# Patient Record
Sex: Female | Born: 1965 | Race: White | Hispanic: No | Marital: Married | Smoking: Never smoker
Health system: Southern US, Community
[De-identification: ages and names within clinical notes are randomized; demographics above are authoritative.]

## PROBLEM LIST (undated history)

## (undated) DIAGNOSIS — Z87442 Personal history of urinary calculi: Secondary | ICD-10-CM

## (undated) DIAGNOSIS — M1990 Unspecified osteoarthritis, unspecified site: Secondary | ICD-10-CM

## (undated) DIAGNOSIS — K219 Gastro-esophageal reflux disease without esophagitis: Secondary | ICD-10-CM

## (undated) DIAGNOSIS — F419 Anxiety disorder, unspecified: Secondary | ICD-10-CM

## (undated) DIAGNOSIS — C439 Malignant melanoma of skin, unspecified: Secondary | ICD-10-CM

## (undated) HISTORY — PX: BACK SURGERY: SHX140

## (undated) HISTORY — PX: AUGMENTATION MAMMAPLASTY: SUR837

## (undated) HISTORY — DX: Anxiety disorder, unspecified: F41.9

## (undated) HISTORY — PX: TUBAL LIGATION: SHX77

---

## 1989-08-19 DIAGNOSIS — Z87442 Personal history of urinary calculi: Secondary | ICD-10-CM

## 1989-08-19 HISTORY — DX: Personal history of urinary calculi: Z87.442

## 2000-08-19 HISTORY — PX: ABDOMINAL HYSTERECTOMY: SHX81

## 2005-09-30 ENCOUNTER — Ambulatory Visit: Payer: Self-pay | Admitting: Dermatology

## 2006-08-06 ENCOUNTER — Emergency Department: Payer: Self-pay | Admitting: Unknown Physician Specialty

## 2009-02-28 ENCOUNTER — Ambulatory Visit: Payer: Self-pay

## 2009-08-19 DIAGNOSIS — C439 Malignant melanoma of skin, unspecified: Secondary | ICD-10-CM

## 2009-08-19 HISTORY — DX: Malignant melanoma of skin, unspecified: C43.9

## 2009-10-25 ENCOUNTER — Encounter: Payer: Self-pay | Admitting: Cardiovascular Disease

## 2009-10-25 ENCOUNTER — Emergency Department: Payer: Self-pay | Admitting: Emergency Medicine

## 2009-10-26 ENCOUNTER — Encounter: Payer: Self-pay | Admitting: Cardiovascular Disease

## 2009-10-26 DIAGNOSIS — R079 Chest pain, unspecified: Secondary | ICD-10-CM | POA: Insufficient documentation

## 2009-10-30 ENCOUNTER — Ambulatory Visit: Payer: Self-pay

## 2009-10-30 ENCOUNTER — Ambulatory Visit: Payer: Self-pay | Admitting: Cardiology

## 2009-10-30 ENCOUNTER — Encounter (HOSPITAL_COMMUNITY): Admission: RE | Admit: 2009-10-30 | Discharge: 2009-12-20 | Payer: Self-pay | Admitting: Cardiovascular Disease

## 2009-12-08 ENCOUNTER — Emergency Department: Payer: Self-pay | Admitting: Unknown Physician Specialty

## 2009-12-08 ENCOUNTER — Ambulatory Visit: Payer: Self-pay | Admitting: Cardiology

## 2009-12-08 ENCOUNTER — Emergency Department: Payer: Self-pay | Admitting: Emergency Medicine

## 2009-12-08 ENCOUNTER — Emergency Department (HOSPITAL_COMMUNITY): Admission: EM | Admit: 2009-12-08 | Discharge: 2009-12-08 | Payer: Self-pay | Admitting: Emergency Medicine

## 2009-12-09 ENCOUNTER — Encounter: Payer: Self-pay | Admitting: Cardiovascular Disease

## 2010-07-18 ENCOUNTER — Ambulatory Visit: Payer: Self-pay

## 2010-09-18 NOTE — Miscellaneous (Signed)
Summary: chest pain  Clinical Lists Changes  Problems: Added new problem of CHEST PAIN UNSPECIFIED (ICD-786.50) Orders: Added new Referral order of Nuclear Stress Test (Nuc Stress Test) - Signed

## 2010-09-18 NOTE — Assessment & Plan Note (Signed)
Summary: Cardiology Nuclear Study  Nuclear Med Background Indications for Stress Test: Evaluation for Ischemia, Post Hospital  Indications Comments: 10/25/09 [facility] UV:OZDGU pain, MI ruled out   History Comments: NO DOCUMENTED CAD  Symptoms: Chest Pressure, Fatigue, Nausea  Symptoms Comments: Last episode of YQ:IHKV am, none now.   Nuclear Pre-Procedure Cardiac Risk Factors: Family History - CAD Caffeine/Decaff Intake: None NPO After: 7:00 PM Lungs: Clear IV 0.9% NS with Angio Cath: 20g     IV Site: (R) AC IV Started by: Irean Hong RN Chest Size (in) 34     Cup Size C     Height (in): 63 Weight (lb): 120 BMI: 21.33  Nuclear Med Study 1 or 2 day study:  1 day     Stress Test Type:  Stress Reading MD:  Willa Rough, MD     Referring MD:  Julien Nordmann, MD Resting Radionuclide:  Technetium 8m Tetrofosmin     Resting Radionuclide Dose:  11.0 mCi  Stress Radionuclide:  Technetium 68m Tetrofosmin     Stress Radionuclide Dose:  33.0 mCi   Stress Protocol Exercise Time (min):  10:40 min     Max HR:  179 bpm     Predicted Max HR:  177 bpm  Max Systolic BP: 148 mm Hg     Percent Max HR:  101.13 %     METS: 12.8 Rate Pressure Product:  42595    Stress Test Technologist:  [employer] CMA-N     Nuclear Technologist:  Domenic Polite CNMT  Rest Procedure  Myocardial perfusion imaging was performed at rest 45 minutes following the intravenous administration of Myoview Technetium 78m Tetrofosmin.  Stress Procedure  The patient exercised for 10:40.  The patient stopped due to fatigue and denied any chest pain.  There were no significant ST-T wave changes.  Myoview was injected at peak exercise and myocardial perfusion imaging was performed after a brief delay.  QPS Raw Data Images:  Normal; no motion artifact; normal heart/lung ratio. Stress Images:  Anterior breast attenuation (implants) Rest Images:  Same as stress Subtraction (SDS):  No evidence of  ischemia. Transient Ischemic Dilatation:  1.1  (Normal <1.22)  Lung/Heart Ratio:  .23  (Normal <0.45)  Quantitative Gated Spect Images QGS EDV:  73 ml QGS ESV:  32 ml QGS EF:  57 % QGS cine images:  Normal motion  Findings Normal nuclear study      Overall Impression  Exercise Capacity: Good exercise capacity. BP Response: Normal blood pressure response. Clinical Symptoms: No chest pain ECG Impression: No significant ST segment change suggestive of ischemia. Overall Impression Comments: There is no scar or ischemia. Wall motion is normal. There is anterior breast attenuation.  Appended Document: Cardiology Nuclear Study pt aware. Charlena Cross RN BSN

## 2010-09-18 NOTE — Procedures (Signed)
Summary: [facility] Holter  [facility] Holter   Imported By: Harlon Flor 01/04/2010 10:52:36  _____________________________________________________________________  External Attachment:    Type:   Image     Comment:   External Document

## 2010-11-06 LAB — POCT I-STAT, CHEM 8
Hemoglobin: 13.3 g/dL (ref 12.0–15.0)
Sodium: 139 mEq/L (ref 135–145)
TCO2: 21 mmol/L (ref 0–100)

## 2010-11-06 LAB — DIFFERENTIAL
Basophils Absolute: 0 10*3/uL (ref 0.0–0.1)
Basophils Relative: 0 % (ref 0–1)
Eosinophils Absolute: 0 10*3/uL (ref 0.0–0.7)
Neutrophils Relative %: 83 % — ABNORMAL HIGH (ref 43–77)

## 2010-11-06 LAB — CBC
MCHC: 34.2 g/dL (ref 30.0–36.0)
MCV: 87.7 fL (ref 78.0–100.0)
Platelets: 272 10*3/uL (ref 150–400)
WBC: 7.6 10*3/uL (ref 4.0–10.5)

## 2010-11-06 LAB — TSH: TSH: 2.764 u[IU]/mL (ref 0.350–4.500)

## 2010-11-06 LAB — D-DIMER, QUANTITATIVE: D-Dimer, Quant: 1.75 ug/mL-FEU — ABNORMAL HIGH (ref 0.00–0.48)

## 2011-10-08 ENCOUNTER — Ambulatory Visit: Payer: Self-pay

## 2012-08-19 HISTORY — PX: BREAST BIOPSY: SHX20

## 2013-04-05 ENCOUNTER — Ambulatory Visit: Payer: Self-pay | Admitting: Family Medicine

## 2013-04-07 ENCOUNTER — Ambulatory Visit: Payer: Self-pay | Admitting: Family Medicine

## 2013-04-22 ENCOUNTER — Ambulatory Visit: Payer: Self-pay | Admitting: Obstetrics and Gynecology

## 2013-04-23 LAB — PATHOLOGY REPORT

## 2013-05-17 ENCOUNTER — Ambulatory Visit: Payer: Self-pay | Admitting: Specialist

## 2013-10-18 LAB — HM PAP SMEAR: HM Pap smear: NEGATIVE

## 2014-01-19 ENCOUNTER — Ambulatory Visit: Payer: Self-pay | Admitting: Obstetrics and Gynecology

## 2014-09-05 ENCOUNTER — Ambulatory Visit: Payer: Self-pay | Admitting: Obstetrics and Gynecology

## 2015-04-13 ENCOUNTER — Other Ambulatory Visit: Payer: Self-pay

## 2015-04-13 NOTE — Telephone Encounter (Signed)
Fax [facility] to refill Lorazepam 1mg  take 1 to 2 tablets at bedtime. She was last seen In January 2016 and no future appts have been made. Please review-aa

## 2015-04-13 NOTE — Telephone Encounter (Signed)
Ok to print 1 month supply==appt for further refills.thx

## 2015-04-14 MED ORDER — LORAZEPAM 1 MG PO TABS: ORAL_TABLET | ORAL | Status: AC

## 2015-04-14 NOTE — Telephone Encounter (Signed)
Pt advised and RX placed up front for pick up-aa

## 2015-06-20 ENCOUNTER — Other Ambulatory Visit: Payer: Self-pay | Admitting: Internal Medicine

## 2015-06-20 DIAGNOSIS — Z1231 Encounter for screening mammogram for malignant neoplasm of breast: Secondary | ICD-10-CM

## 2015-06-21 ENCOUNTER — Ambulatory Visit
Admission: RE | Admit: 2015-06-21 | Discharge: 2015-06-21 | Disposition: A | Discharge status: Home / Self Care | Payer: No Typology Code available for payment source | Source: Ambulatory Visit | Attending: Internal Medicine | Admitting: Internal Medicine

## 2015-06-21 DIAGNOSIS — Z9882 Breast implant status: Secondary | ICD-10-CM | POA: Diagnosis not present

## 2015-06-21 DIAGNOSIS — Z1231 Encounter for screening mammogram for malignant neoplasm of breast: Secondary | ICD-10-CM | POA: Diagnosis present

## 2015-10-23 ENCOUNTER — Encounter: Payer: Self-pay | Admitting: *Deleted

## 2015-10-26 ENCOUNTER — Encounter: Payer: Self-pay | Admitting: Obstetrics and Gynecology

## 2015-11-30 ENCOUNTER — Emergency Department
Admission: EM | Admit: 2015-11-30 | Discharge: 2015-11-30 | Disposition: A | Discharge status: Home / Self Care | Payer: BLUE CROSS/BLUE SHIELD | Attending: Emergency Medicine | Admitting: Emergency Medicine

## 2015-11-30 ENCOUNTER — Encounter: Payer: Self-pay | Admitting: Obstetrics and Gynecology

## 2015-11-30 ENCOUNTER — Emergency Department: Payer: BLUE CROSS/BLUE SHIELD

## 2015-11-30 DIAGNOSIS — R002 Palpitations: Secondary | ICD-10-CM | POA: Diagnosis not present

## 2015-11-30 DIAGNOSIS — I10 Essential (primary) hypertension: Secondary | ICD-10-CM | POA: Diagnosis present

## 2015-11-30 LAB — CBC WITH DIFFERENTIAL/PLATELET
BASOS PCT: 1 %
Basophils Absolute: 0.1 10*3/uL (ref 0–0.1)
EOS ABS: 0.1 10*3/uL (ref 0–0.7)
EOS PCT: 1 %
HCT: 36.5 % (ref 35.0–47.0)
HEMOGLOBIN: 12.9 g/dL (ref 12.0–16.0)
Lymphocytes Relative: 45 %
Lymphs Abs: 3.1 10*3/uL (ref 1.0–3.6)
MCH: 29 pg (ref 26.0–34.0)
MCHC: 35.3 g/dL (ref 32.0–36.0)
MCV: 82 fL (ref 80.0–100.0)
Monocytes Absolute: 0.5 10*3/uL (ref 0.2–0.9)
Monocytes Relative: 8 %
NEUTROS PCT: 45 %
Neutro Abs: 3.1 10*3/uL (ref 1.4–6.5)
PLATELETS: 294 10*3/uL (ref 150–440)
RBC: 4.45 MIL/uL (ref 3.80–5.20)
RDW: 13.5 % (ref 11.5–14.5)
WBC: 6.9 10*3/uL (ref 3.6–11.0)

## 2015-11-30 LAB — BASIC METABOLIC PANEL
Anion gap: 10 (ref 5–15)
BUN: 15 mg/dL (ref 6–20)
CALCIUM: 9.1 mg/dL (ref 8.9–10.3)
CO2: 20 mmol/L — ABNORMAL LOW (ref 22–32)
CREATININE: 0.72 mg/dL (ref 0.44–1.00)
Chloride: 104 mmol/L (ref 101–111)
GFR calc non Af Amer: >60 mL/min (ref >60)
Glucose, Bld: 117 mg/dL — ABNORMAL HIGH (ref 65–99)
Potassium: 2.8 mmol/L — CL (ref 3.5–5.1)
SODIUM: 134 mmol/L — AB (ref 135–145)

## 2015-11-30 LAB — TSH: TSH: 2.818 u[IU]/mL (ref 0.350–4.500)

## 2015-11-30 LAB — TROPONIN I

## 2015-11-30 MED ORDER — POTASSIUM CHLORIDE CRYS ER 20 MEQ PO TBCR
20.0000 meq | EXTENDED_RELEASE_TABLET | Freq: Once | ORAL | Status: AC
  Administered 2015-11-30: 20 meq via ORAL
  Filled 2015-11-30: qty 1

## 2015-11-30 MED ORDER — LORAZEPAM 2 MG/ML IJ SOLN
1.0000 mg | Freq: Once | INTRAMUSCULAR | Status: AC
  Administered 2015-11-30: 1 mg via INTRAVENOUS
  Filled 2015-11-30: qty 1

## 2015-11-30 MED ORDER — SODIUM CHLORIDE 0.9 % IV BOLUS (SEPSIS)
500.0000 mL | Freq: Once | INTRAVENOUS | Status: AC
  Administered 2015-11-30: 500 mL via INTRAVENOUS

## 2015-11-30 MED ORDER — POTASSIUM CHLORIDE ER 20 MEQ PO TBCR: 10.0000 meq | EXTENDED_RELEASE_TABLET | Freq: Every day | ORAL | Status: DC

## 2015-11-30 MED ORDER — KETOROLAC TROMETHAMINE 30 MG/ML IJ SOLN
INTRAMUSCULAR | Status: AC
  Filled 2015-11-30: qty 1

## 2015-11-30 NOTE — ED Notes (Signed)
Patient transported to X-ray 

## 2015-11-30 NOTE — ED Notes (Addendum)
Pt frm work via EMS, reports she hasnt "been feeling good all day" is on antibiotics for strep throat. Reports tingling in the back of her head and presents with hypertension. Pt was hypertensive for EMS, not currently on assessment

## 2015-11-30 NOTE — ED Provider Notes (Signed)
Time Seen: Approximately 2120 I have reviewed the triage notes  Chief Complaint: Hypertension   History of Present Illness: Veronica Montes is a 50 y.o. female who states she's on the second course of antibiotic therapy for strep throat. She was first on amoxicillin and then now was started recently on Augmentin. Patient states that she has not had a fever. She was concerned because she "" hasn't felt well all day "". She apparently had some numbness and tingling on the right side of her face and felt that her heart was racing at home. She states her pulse was approximately 140. She denies any near-syncope or syncopal episodes. Patient denies any significant chest pain or shortness of breath. Her husband and goes on to state that she does have a history of anxiety and normally takes Ativan at night along with trazodone. Patient denies any heart palpitations at present. She denies any focal weakness or difficulty with speech or swallowing. Past Medical History  Diagnosis Date  . Anxiety     Patient Active Problem List   Diagnosis Date Noted  . CHEST PAIN UNSPECIFIED 10/26/2009    Past Surgical History  Procedure Laterality Date  . Augmentation mammaplasty Bilateral 10+yrs ago  . Breast biopsy Left 2014    neg  . Abdominal hysterectomy  2002  . Tubal ligation      Past Surgical History  Procedure Laterality Date  . Augmentation mammaplasty Bilateral 10+yrs ago  . Breast biopsy Left 2014    neg  . Abdominal hysterectomy  2002  . Tubal ligation      Current Outpatient Rx  Name  Route  Sig  Dispense  Refill  . LORazepam (ATIVAN) 1 MG tablet      Take 1 to 2 tablets at bedtime as needed   60 tablet   0     Allergies:  Review of patient's allergies indicates no known allergies.  Family History: Family History  Problem Relation Age of Onset  . Heart disease Brother     Social History: Social History  Substance Use Topics  . Smoking status: Never Smoker   .  Smokeless tobacco: Never Used  . Alcohol Use: No     Review of Systems:   10 point review of systems was performed and was otherwise negative:  Constitutional: No fever Eyes: No visual disturbances ENT: No sore throat, ear pain Cardiac: No chest pain Respiratory: No shortness of breath, wheezing, or stridor Abdomen: No abdominal pain, no vomiting, No diarrhea Endocrine: No weight loss, No night sweats Extremities: No peripheral edema, cyanosis Skin: No rashes, easy bruising Neurologic: No focal weakness, trouble with speech or swollowing Urologic: No dysuria, Hematuria, or urinary frequency   Physical Exam:  ED Triage Vitals  Enc Vitals Group     BP 11/30/15 2058 137/90 mmHg     Pulse Rate 11/30/15 2058 91     Resp 11/30/15 2059 20     Temp --      Temp src --      SpO2 11/30/15 2058 100 %     Weight 11/30/15 2056 129 lb (58.514 kg)     Height 11/30/15 2056 5\' 3"  (1.6 m)     Head Cir --      Peak Flow --      Pain Score --      Pain Loc --      Pain Edu? --      Excl. in [place]? --     General: Awake ,  Alert , and Oriented times 3; GCS 15 Head: Normal cephalic , atraumatic Eyes: Pupils equal , round, reactive to light Nose/Throat: No nasal drainage, patent upper airway without erythema or exudate.  Neck: Supple, Full range of motion, No anterior adenopathy or palpable thyroid masses Lungs: Clear to ascultation without wheezes , rhonchi, or rales Heart: Regular rate, regular rhythm without murmurs , gallops , or rubs Abdomen: Soft, non tender without rebound, guarding , or rigidity; bowel sounds positive and symmetric in all 4 quadrants. No organomegaly .        Extremities: 2 plus symmetric pulses. No edema, clubbing or cyanosis Neurologic: normal ambulation, Motor symmetric without deficits, sensory intact Skin: warm, dry, no rashes   Labs:   All laboratory work was reviewed including any pertinent negatives or positives listed below:  Labs Reviewed  BASIC  METABOLIC PANEL  CBC WITH DIFFERENTIAL/PLATELET  TROPONIN I  TSH    EKG:  ED ECG REPORT I, Daymon Larsen, the attending physician, personally viewed and interpreted this ECG.  Date: 11/30/2015 EKG Time: 2059 Rate: 98 Rhythm: normal sinus rhythm QRS Axis: normal Intervals: normal ST/T Wave abnormalities: normal Conduction Disturbances: none Narrative Interpretation: unremarkable No acute ischemic changes   Radiology:  Narrative:    CLINICAL DATA: Tachycardia and paresthesias. Hypertensive.  EXAM: CHEST 2 VIEW  COMPARISON: 12/08/2009  FINDINGS: The heart size and mediastinal contours are within normal limits. Both lungs are clear. The visualized skeletal structures are unremarkable.  IMPRESSION: No active cardiopulmonary disease.   Electronically Signed By: Andreas Newport M.D. On: 11/30/2015 21:38       I personally reviewed the radiologic studies    ED Course: * Patient's stay was uneventful and she had some mild hypokalemia was given oral potassium here in emergency department. I felt she was stable. She may have had some anxiety with some heart palpitations but I felt this was unlikely to be any serious arrhythmia.    Assessment: * Heart palpitations Anxiety     Plan:  Outpatient management Patient was advised to return immediately if condition worsens. Patient was advised to follow up with their primary care physician or other specialized physicians involved in their outpatient care. The patient and/or family member/power of attorney had laboratory results reviewed at the bedside. All questions and concerns were addressed and appropriate discharge instructions were distributed by the nursing staff.             Daymon Larsen, MD 11/30/15 [phone]

## 2015-11-30 NOTE — Discharge Instructions (Signed)
Palpitations A palpitation is the feeling that your heartbeat is irregular or is faster than normal. It may feel like your heart is fluttering or skipping a beat. Palpitations are usually not a serious problem. However, in some cases, you may need further medical evaluation. CAUSES  Palpitations can be caused by:  Smoking.  Caffeine or other stimulants, such as diet pills or energy drinks.  Alcohol.  Stress and anxiety.  Strenuous physical activity.  Fatigue.  Certain medicines.  Heart disease, especially if you have a history of irregular heart rhythms (arrhythmias), such as atrial fibrillation, atrial flutter, or supraventricular tachycardia.  An improperly working pacemaker or defibrillator. DIAGNOSIS  To find the cause of your palpitations, your health care provider will take your medical history and perform a physical exam. Your health care provider may also have you take a test called an ambulatory electrocardiogram (ECG). An ECG records your heartbeat patterns over a 24-hour period. You may also have other tests, such as:  Transthoracic echocardiogram (TTE). During echocardiography, sound waves are used to evaluate how blood flows through your heart.  Transesophageal echocardiogram (TEE).  Cardiac monitoring. This allows your health care provider to monitor your heart rate and rhythm in real time.  Holter monitor. This is a portable device that records your heartbeat and can help diagnose heart arrhythmias. It allows your health care provider to track your heart activity for several days, if needed.  Stress tests by exercise or by giving medicine that makes the heart beat faster. TREATMENT  Treatment of palpitations depends on the cause of your symptoms and can vary greatly. Most cases of palpitations do not require any treatment other than time, relaxation, and monitoring your symptoms. Other causes, such as atrial fibrillation, atrial flutter, or supraventricular  tachycardia, usually require further treatment. HOME CARE INSTRUCTIONS   Avoid:  Caffeinated coffee, tea, soft drinks, diet pills, and energy drinks.  Chocolate.  Alcohol.  Stop smoking if you smoke.  Reduce your stress and anxiety. Things that can help you relax include:  A method of controlling things in your body, such as your heartbeats, with your mind (biofeedback).  Yoga.  Meditation.  Physical activity such as swimming, jogging, or walking.  Get plenty of rest and sleep. SEEK MEDICAL CARE IF:   You continue to have a fast or irregular heartbeat beyond 24 hours.  Your palpitations occur more often. SEEK IMMEDIATE MEDICAL CARE IF:  You have chest pain or shortness of breath.  You have a severe headache.  You feel dizzy or you faint. MAKE SURE YOU:  Understand these instructions.  Will watch your condition.  Will get help right away if you are not doing well or get worse.   This information is not intended to replace advice given to you by your health care provider. Make sure you discuss any questions you have with your health care provider.   Document Released: 08/02/2000 Document Revised: 08/10/2013 Document Reviewed: 10/04/2011 Elsevier Interactive Patient Education [employer].  Please return immediately if condition worsens. Please contact her primary physician or the physician you were given for referral. If you have any specialist physicians involved in her treatment and plan please also contact them. Thank you for using [facility].

## 2015-12-28 ENCOUNTER — Encounter: Payer: Self-pay | Admitting: Obstetrics and Gynecology

## 2016-01-03 ENCOUNTER — Other Ambulatory Visit: Payer: Self-pay | Admitting: Obstetrics and Gynecology

## 2016-01-03 DIAGNOSIS — Z1231 Encounter for screening mammogram for malignant neoplasm of breast: Secondary | ICD-10-CM

## 2016-01-08 ENCOUNTER — Encounter: Payer: Self-pay | Admitting: Obstetrics and Gynecology

## 2016-01-08 ENCOUNTER — Ambulatory Visit (INDEPENDENT_AMBULATORY_CARE_PROVIDER_SITE_OTHER): Payer: BLUE CROSS/BLUE SHIELD | Admitting: Obstetrics and Gynecology

## 2016-01-08 VITALS — BP 139/79 | HR 84 | Ht 63.0 in | Wt 131.0 lb

## 2016-01-08 DIAGNOSIS — Z01419 Encounter for gynecological examination (general) (routine) without abnormal findings: Secondary | ICD-10-CM

## 2016-01-08 NOTE — Patient Instructions (Signed)
Place annual gynecologic exam patient instructions here.

## 2016-01-08 NOTE — Progress Notes (Signed)
Subjective:   Veronica Montes is a 50 y.o. G15P0 Caucasian female here for a routine well-woman exam.  No LMP recorded. Patient has had a hysterectomy.    Current complaints: none PCP: [facility]       does desire labs  Social History: Sexual: heterosexual Marital Status: married Living situation: with spouse Occupation: self-employed Tobacco/alcohol: no tobacco use Illicit drugs: no history of illicit drug use  The following portions of the patient's history were reviewed and updated as appropriate: allergies, current medications, past family history, past medical history, past social history, past surgical history and problem list.  Past Medical History Past Medical History  Diagnosis Date  . Anxiety     Past Surgical History Past Surgical History  Procedure Laterality Date  . Augmentation mammaplasty Bilateral 10+yrs ago  . Breast biopsy Left 2014    neg  . Abdominal hysterectomy  2002  . Tubal ligation      Gynecologic History G2P0  No LMP recorded. Patient has had a hysterectomy. Contraception: status post hysterectomy Last Pap: 2015. Results were: normal Last mammogram: 2016. Results were: normal   Obstetric History OB History  Gravida Para Term Preterm AB SAB TAB Ectopic Multiple Living  2         1    # Outcome Date GA Lbr Len/2nd Weight Sex Delivery Anes PTL Lv  2 Gravida 1997    F Vag-Spont     1 Gravida 1991    F Vag-Spont   Y      Current Medications Current Outpatient Prescriptions on File Prior to Visit  Medication Sig Dispense Refill  . LORazepam (ATIVAN) 1 MG tablet Take 1 to 2 tablets at bedtime as needed 60 tablet 0  . potassium chloride 20 MEQ TBCR Take 10 mEq by mouth daily. 7 tablet 0   No current facility-administered medications on file prior to visit.    Review of Systems Patient denies any headaches, blurred vision, shortness of breath, chest pain, abdominal pain, problems with bowel movements, urination, or intercourse.  Objective:   BP 139/79 mmHg  Pulse 84  Ht 5\' 3"  (1.6 m)  Wt 131 lb (59.421 kg)  BMI 23.21 kg/m2 Physical Exam  General:  Well developed, well nourished, no acute distress. She is alert and oriented x3. Skin:  Warm and dry Neck:  Midline trachea, no thyromegaly or nodules Cardiovascular: Regular rate and rhythm, no murmur heard Lungs:  Effort normal, all lung fields clear to auscultation bilaterally Breasts:  No dominant palpable mass, retraction, or nipple discharge Abdomen:  Soft, non tender, no hepatosplenomegaly or masses Pelvic:  External genitalia is normal in appearance.  The vagina is normal in appearance. The cervix is bulbous, no CMT.  Thin prep pap is not done. Uterus is surgically absent. No adnexal masses or tenderness noted. Extremities:  No swelling or varicosities noted Psych:  She has a normal mood and affect  Assessment:   Healthy well-woman exam Bilateral breast implants  Plan:  Labs ordered F/U 1 year for AE, or sooner if needed Mammogram scheduled  Jedd Schulenburg Rockney Ghee, CNM

## 2016-01-09 LAB — COMPREHENSIVE METABOLIC PANEL
ALT: 10 IU/L (ref 0–32)
AST: 12 IU/L (ref 0–40)
Albumin/Globulin Ratio: 2 (ref 1.2–2.2)
Albumin: 4.7 g/dL (ref 3.5–5.5)
Alkaline Phosphatase: 55 IU/L (ref 39–117)
BUN/Creatinine Ratio: 12 (ref 9–23)
BUN: 15 mg/dL (ref 6–24)
Bilirubin Total: 0.4 mg/dL (ref 0.0–1.2)
CO2: 24 mmol/L (ref 18–29)
CREATININE: 1.28 mg/dL — AB (ref 0.57–1.00)
Calcium: 9.6 mg/dL (ref 8.7–10.2)
Chloride: 98 mmol/L (ref 96–106)
GFR calc Af Amer: 57 mL/min/{1.73_m2} — ABNORMAL LOW (ref >59)
GFR, EST NON AFRICAN AMERICAN: 49 mL/min/{1.73_m2} — AB (ref >59)
GLOBULIN, TOTAL: 2.3 g/dL (ref 1.5–4.5)
Glucose: 81 mg/dL (ref 65–99)
Potassium: 3.8 mmol/L (ref 3.5–5.2)
Sodium: 139 mmol/L (ref 134–144)
Total Protein: 7 g/dL (ref 6.0–8.5)

## 2016-01-09 LAB — LIPID PANEL
CHOL/HDL RATIO: 3 ratio (ref 0.0–4.4)
CHOLESTEROL TOTAL: 193 mg/dL (ref 100–199)
HDL: 65 mg/dL (ref >39)
LDL CALC: 111 mg/dL — AB (ref 0–99)
TRIGLYCERIDES: 86 mg/dL (ref 0–149)
VLDL CHOLESTEROL CAL: 17 mg/dL (ref 5–40)

## 2016-01-09 LAB — VITAMIN D 25 HYDROXY (VIT D DEFICIENCY, FRACTURES): VIT D 25 HYDROXY: 29.2 ng/mL — AB (ref 30.0–100.0)

## 2016-01-10 ENCOUNTER — Encounter: Payer: Self-pay | Admitting: Obstetrics and Gynecology

## 2016-05-21 ENCOUNTER — Other Ambulatory Visit: Payer: Self-pay | Admitting: Obstetrics and Gynecology

## 2016-05-21 MED ORDER — FLUCONAZOLE 150 MG PO TABS: 150.0000 mg | ORAL_TABLET | Freq: Once | ORAL | 3 refills | Status: AC

## 2016-05-27 ENCOUNTER — Other Ambulatory Visit: Payer: Self-pay | Admitting: *Deleted

## 2016-05-27 MED ORDER — TERCONAZOLE 0.4 % VA CREA: 1.0000 | TOPICAL_CREAM | Freq: Every day | VAGINAL | 1 refills | Status: DC

## 2016-06-27 ENCOUNTER — Other Ambulatory Visit: Payer: Self-pay | Admitting: Obstetrics and Gynecology

## 2016-06-27 ENCOUNTER — Ambulatory Visit
Admission: RE | Admit: 2016-06-27 | Discharge: 2016-06-27 | Disposition: A | Discharge status: Home / Self Care | Payer: BLUE CROSS/BLUE SHIELD | Source: Ambulatory Visit | Attending: Obstetrics and Gynecology | Admitting: Obstetrics and Gynecology

## 2016-06-27 DIAGNOSIS — Z1231 Encounter for screening mammogram for malignant neoplasm of breast: Secondary | ICD-10-CM

## 2016-08-26 ENCOUNTER — Encounter: Payer: Self-pay | Admitting: *Deleted

## 2016-08-26 DIAGNOSIS — R1013 Epigastric pain: Secondary | ICD-10-CM | POA: Diagnosis not present

## 2016-08-26 LAB — URINALYSIS, COMPLETE (UACMP) WITH MICROSCOPIC
BACTERIA UA: NONE SEEN
Bilirubin Urine: NEGATIVE
GLUCOSE, UA: NEGATIVE mg/dL
Hgb urine dipstick: NEGATIVE
KETONES UR: NEGATIVE mg/dL
Leukocytes, UA: NEGATIVE
NITRITE: NEGATIVE
PROTEIN: NEGATIVE mg/dL
Specific Gravity, Urine: 1.014 (ref 1.005–1.030)
WBC, UA: NONE SEEN WBC/hpf (ref 0–5)
pH: 8 (ref 5.0–8.0)

## 2016-08-26 LAB — CBC
HEMATOCRIT: 37.2 % (ref 35.0–47.0)
Hemoglobin: 13 g/dL (ref 12.0–16.0)
MCH: 29.4 pg (ref 26.0–34.0)
MCHC: 34.9 g/dL (ref 32.0–36.0)
MCV: 84.1 fL (ref 80.0–100.0)
Platelets: 275 10*3/uL (ref 150–440)
RBC: 4.42 MIL/uL (ref 3.80–5.20)
RDW: 13 % (ref 11.5–14.5)
WBC: 7.2 10*3/uL (ref 3.6–11.0)

## 2016-08-26 LAB — COMPREHENSIVE METABOLIC PANEL
ALBUMIN: 4.2 g/dL (ref 3.5–5.0)
ALK PHOS: 49 U/L (ref 38–126)
ALT: 12 U/L — ABNORMAL LOW (ref 14–54)
AST: 19 U/L (ref 15–41)
Anion gap: 5 (ref 5–15)
BILIRUBIN TOTAL: 0.5 mg/dL (ref 0.3–1.2)
BUN: 18 mg/dL (ref 6–20)
CALCIUM: 9.2 mg/dL (ref 8.9–10.3)
CO2: 27 mmol/L (ref 22–32)
Chloride: 105 mmol/L (ref 101–111)
Creatinine, Ser: 0.9 mg/dL (ref 0.44–1.00)
GFR calc Af Amer: >60 mL/min (ref >60)
GFR calc non Af Amer: >60 mL/min (ref >60)
GLUCOSE: 100 mg/dL — AB (ref 65–99)
POTASSIUM: 3.7 mmol/L (ref 3.5–5.1)
Sodium: 137 mmol/L (ref 135–145)
TOTAL PROTEIN: 6.9 g/dL (ref 6.5–8.1)

## 2016-08-26 LAB — LIPASE, BLOOD: Lipase: 41 U/L (ref 11–51)

## 2016-08-26 NOTE — ED Triage Notes (Signed)
Pt states she has upper abd pain.  No v/d.  Pt taking zantac and protonix without relief.  Pt alert.

## 2016-08-27 ENCOUNTER — Emergency Department: Payer: BLUE CROSS/BLUE SHIELD

## 2016-08-27 ENCOUNTER — Emergency Department
Admission: EM | Admit: 2016-08-27 | Discharge: 2016-08-27 | Disposition: A | Discharge status: Home / Self Care | Payer: BLUE CROSS/BLUE SHIELD | Attending: Emergency Medicine | Admitting: Emergency Medicine

## 2016-08-27 DIAGNOSIS — R1013 Epigastric pain: Secondary | ICD-10-CM

## 2016-08-27 DIAGNOSIS — R109 Unspecified abdominal pain: Secondary | ICD-10-CM

## 2016-08-27 LAB — TROPONIN I: Troponin I: <0.03 ng/mL (ref <0.03)

## 2016-08-27 MED ORDER — SUCRALFATE 1 G PO TABS
1.0000 g | ORAL_TABLET | Freq: Once | ORAL | Status: AC
  Administered 2016-08-27: 1 g via ORAL
  Filled 2016-08-27: qty 1

## 2016-08-27 MED ORDER — GI COCKTAIL ~~LOC~~
30.0000 mL | Freq: Once | ORAL | Status: AC
  Administered 2016-08-27: 30 mL via ORAL
  Filled 2016-08-27: qty 30

## 2016-08-27 MED ORDER — SUCRALFATE 1 G PO TABS: 1.0000 g | ORAL_TABLET | Freq: Two times a day (BID) | ORAL | 0 refills | Status: DC

## 2016-08-27 NOTE — ED Provider Notes (Signed)
[facility] Emergency Department Provider Note   ____________________________________________   First MD Initiated Contact with Patient 08/27/16 0136     (approximate)  I have reviewed the triage vital signs and the nursing notes.   HISTORY  Chief Complaint Abdominal Pain    HPI Veronica Montes is a 51 y.o. female who comes into the hospital today with upper abdominal pain. The patient reports thatshe has been having some upper abdominal pain for about a month. She reports that she's been trying to take some Zantac and Protonix but it has not been helping. The patient reports that although the pain has been constant since it seemed to be worse this morning. She tried to get another doctor's office but they were unavailable. The patient reports that she came into the hospital today for evaluation. She rates the pain a 7 out of 10 in intensity and reports that her pain is in her epigastric and left upper quadrant areas. The patient reports that nothing seems to make it worse including different foods. The patient denies any nausea, vomiting, chest pain or shortness of breath. She's never had this pain before. She has been eating and drinking as well as urinating and having normal bowel movements.   Past Medical History:  Diagnosis Date  . Anxiety   . Cancer [facility])    melanoma    Patient Active Problem List   Diagnosis Date Noted  . CHEST PAIN UNSPECIFIED 10/26/2009    Past Surgical History:  Procedure Laterality Date  . ABDOMINAL HYSTERECTOMY  2002  . AUGMENTATION MAMMAPLASTY Bilateral 10+yrs ago  . BREAST BIOPSY Left 2014   bx/clip-neg  . TUBAL LIGATION      Prior to Admission medications   Medication Sig Start Date End Date Taking? Authorizing Provider  LORazepam (ATIVAN) 1 MG tablet Take 1 to 2 tablets at bedtime as needed 04/14/15   Jerrol Banana., MD  potassium chloride 20 MEQ TBCR Take 10 mEq by mouth daily. 11/30/15 12/07/15  Daymon Larsen, MD  sucralfate (CARAFATE) 1 g tablet Take 1 tablet (1 g total) by mouth 2 (two) times daily. 08/27/16   Loney Hering, MD  terconazole (TERAZOL 7) 0.4 % vaginal cream Place 1 applicator vaginally at bedtime. 05/27/16   Melody N Shambley, CNM  TraZODone HCl 150 MG TB24 Take by mouth.    Historical Provider, MD    Allergies Patient has no known allergies.  Family History  Problem Relation Age of Onset  . Heart disease Brother   . Breast cancer Neg Hx     Social History Social History  Substance Use Topics  . Smoking status: Never Smoker  . Smokeless tobacco: Never Used  . Alcohol use No    Review of Systems Constitutional: No fever/chills Eyes: No visual changes. ENT: No sore throat. Cardiovascular: Denies chest pain. Respiratory: Denies shortness of breath. Gastrointestinal:  abdominal pain.  No nausea, no vomiting.  No diarrhea.  No constipation. Genitourinary: Negative for dysuria. Musculoskeletal: Negative for back pain. Skin: Negative for rash. Neurological: Negative for headaches, focal weakness or numbness.  10-point ROS otherwise negative.  ____________________________________________   PHYSICAL EXAM:  VITAL SIGNS: ED Triage Vitals  Enc Vitals Group     BP 08/26/16 2145 (!) 157/98     Pulse Rate 08/26/16 2145 72     Resp 08/26/16 2145 20     Temp 08/26/16 2145 98.3 F (36.8 C)     Temp Source 08/26/16 2145 Oral  SpO2 08/26/16 2145 98 %     Weight 08/26/16 2146 134 lb (60.8 kg)     Height 08/26/16 2146 5\' 3"  (1.6 m)     Head Circumference --      Peak Flow --      Pain Score 08/26/16 2146 8     Pain Loc --      Pain Edu? --      Excl. in [place]? --     Constitutional: Alert and oriented. Well appearing and in Moderate distress. Eyes: Conjunctivae are normal. PERRL. EOMI. Head: Atraumatic. Nose: No congestion/rhinnorhea. Mouth/Throat: Mucous membranes are moist.  Oropharynx non-erythematous. Cardiovascular: Normal rate, regular rhythm.  Grossly normal heart sounds.  Good peripheral circulation. Respiratory: Normal respiratory effort.  No retractions. Lungs CTAB. Gastrointestinal: Soft with epigastric tenderness upper quadrant tenderness to palpation. No distention. Positive bowel sounds Musculoskeletal: No lower extremity tenderness nor edema.   Neurologic:  Normal speech and language.  Skin:  Skin is warm, dry and intact. Psychiatric: Mood and affect are normal.   ____________________________________________   LABS (all labs ordered are listed, but only abnormal results are displayed)  Labs Reviewed  COMPREHENSIVE METABOLIC PANEL - Abnormal; Notable for the following:       Result Value   Glucose, Bld 100 (*)    ALT 12 (*)    All other components within normal limits  URINALYSIS, COMPLETE (UACMP) WITH MICROSCOPIC - Abnormal; Notable for the following:    Color, Urine YELLOW (*)    APPearance CLOUDY (*)    Squamous Epithelial / LPF 0-5 (*)    All other components within normal limits  LIPASE, BLOOD  CBC  TROPONIN I   ____________________________________________  EKG  ED ECG REPORT I, Loney Hering, the attending physician, personally viewed and interpreted this ECG.   Date: 08/27/2016  EKG Time: 143  Rate: 59  Rhythm: normal sinus rhythm  Axis: normal  Intervals:none  ST&T Change: none  ____________________________________________  RADIOLOGY  [place] abd and pelvis CXR ____________________________________________   PROCEDURES  Procedure(s) performed: None  Procedures  Critical Care performed: No  ____________________________________________   INITIAL IMPRESSION / ASSESSMENT AND PLAN / ED COURSE  Pertinent labs & imaging results that were available during my care of the patient were reviewed by me and considered in my medical decision making (see chart for details).  This is a 51 year old female who comes into the hospital today with some epigastric and left upper quadrant  abdominal pain. The patient has been having this pain on and off for a month but it worsened today. The patient had some blood work checked and it was unremarkable. The patient did not have an elevated troponin which was done more than 4 hours after her arrival and the onset of the pain. She had no elevation of her lipase nor her white blood cell count. The patient also had a chest x-ray and an ultrasound that was unremarkable. The patient received a GI cocktail for her pain and she reports that it did improve slightly. I feel that the patient may have some gastritis or an ulcer. I will refer the patient to a GI physician so she can receive an endoscopy and further evaluate this pain. Otherwise the patient will be discharged home to follow-up.  Clinical Course as of Aug 27 502  Tue Aug 27, 2016  [id number] Clear lungs. DG Chest 2 View [AW]  0322 Unremarkable ultrasound of the right upper quadrant. US Abdomen Limited RUQ [AW]  Clinical Course User Index [AW] Loney Hering, MD     ____________________________________________   FINAL CLINICAL IMPRESSION(S) / ED DIAGNOSES  Final diagnoses:  Abdominal pain  Epigastric pain      NEW MEDICATIONS STARTED DURING THIS VISIT:  Discharge Medication List as of 08/27/2016  4:14 AM    START taking these medications   Details  sucralfate (CARAFATE) 1 g tablet Take 1 tablet (1 g total) by mouth 2 (two) times daily., Starting Tue 08/27/2016, Print         Note:  This document was prepared using Dragon voice recognition software and may include unintentional dictation errors.    Loney Hering, MD 08/27/16 [phone]

## 2016-08-28 ENCOUNTER — Other Ambulatory Visit: Payer: Self-pay | Admitting: *Deleted

## 2016-08-28 ENCOUNTER — Ambulatory Visit: Payer: BLUE CROSS/BLUE SHIELD | Admitting: Gastroenterology

## 2016-09-02 ENCOUNTER — Other Ambulatory Visit: Payer: Self-pay | Admitting: *Deleted

## 2016-09-02 ENCOUNTER — Ambulatory Visit (INDEPENDENT_AMBULATORY_CARE_PROVIDER_SITE_OTHER): Payer: BLUE CROSS/BLUE SHIELD | Admitting: Gastroenterology

## 2016-09-02 ENCOUNTER — Encounter: Payer: Self-pay | Admitting: Gastroenterology

## 2016-09-02 VITALS — BP 118/78 | HR 87 | Temp 98.1°F | Ht 63.0 in | Wt 133.0 lb

## 2016-09-02 DIAGNOSIS — Z1212 Encounter for screening for malignant neoplasm of rectum: Secondary | ICD-10-CM

## 2016-09-02 DIAGNOSIS — Z1211 Encounter for screening for malignant neoplasm of colon: Secondary | ICD-10-CM

## 2016-09-02 DIAGNOSIS — R1012 Left upper quadrant pain: Secondary | ICD-10-CM | POA: Diagnosis not present

## 2016-09-02 NOTE — Patient Instructions (Signed)
EGD/COLONOSCOPY SCHEDULE FOR 09/24/16 Labs ordered

## 2016-09-02 NOTE — Progress Notes (Signed)
Gastroenterology Consultation  Referring Provider:     Idelle Crouch, MD Primary Care Physician:  Idelle Crouch, MD Primary Gastroenterologist:  Dr. Jonathon Bellows  Reason for Consultation:     Abdominal pain         HPI:   Veronica Montes is a 51 y.o. y/o female referred for consultation & management  by Dr. Idelle Crouch, MD.   She has been referred for abdominal pain. She was seen at the ER on 08/27/16 with abdominal pain. She was treated with a GI cocktail and discharged. Urine analysis,LFT's ,BMP,CBC-was normal . USG RUQ showed no stones, normal CBD and was overall a normal study  .  Abdominal pain: Onset: ongoing for about 6 weeks, getting worse , constant - throughout the day , does not wake her up at night  Site :left upper part of her abdomen , comes in waves, each wave lasts for a few minutes, feels like a squeeze.  Radiation: all over her abdomen  Severity :at the maximum is 8/10  Nature of pain: as above  Aggravating factors: nothing  Relieving factors :nothing  Weight loss: no  NSAID use: motrin 800 mg BIDx 5-6 weeks for her abdominal pain  PPI use : protonix 40 mg , daily , first thing in the morning , carafate BID x 10 days - completed taking it  Gall bladder surgery: no  Frequency of bowel movements:  Every day , hard  Change in bowel movements: no  Relief with bowel movements: no  Gas/Bloating/Abdominal distension: not really   She has never had a colonoscopy , no family history of colon cancer or polyps.     Past Medical History:  Diagnosis Date  . Anxiety   . Cancer ([place])    melanoma    Past Surgical History:  Procedure Laterality Date  . ABDOMINAL HYSTERECTOMY  2002  . AUGMENTATION MAMMAPLASTY Bilateral 10+yrs ago  . BREAST BIOPSY Left 2014   bx/clip-neg  . TUBAL LIGATION      Prior to Admission medications   Medication Sig Start Date End Date Taking? Authorizing Provider  LORazepam (ATIVAN) 1 MG tablet Take 1 to 2 tablets at bedtime as  needed 04/14/15   Jerrol Banana., MD  pantoprazole (PROTONIX) 40 MG tablet Take by mouth. 06/17/16 06/17/17  Historical Provider, MD  ranitidine (ZANTAC) 300 MG tablet Take by mouth. 06/17/16 06/17/17  Historical Provider, MD  traZODone (DESYREL) 150 MG tablet  07/26/16   Historical Provider, MD  TraZODone HCl 150 MG TB24 Take by mouth.    Historical Provider, MD    Family History  Problem Relation Age of Onset  . Heart disease Brother   . Breast cancer Neg Hx      Social History  Substance Use Topics  . Smoking status: Never Smoker  . Smokeless tobacco: Never Used  . Alcohol use No    Allergies as of 09/02/2016  . (No Known Allergies)    Review of Systems:    All systems reviewed and negative except where noted in HPI.   Physical Exam:  BP 118/78   Pulse 87   Temp 98.1 F (36.7 C)   Ht 5\' 3"  (1.6 m)   Wt 133 lb (60.3 kg)   BMI 23.56 kg/m  No LMP recorded. Patient has had a hysterectomy. Psych:  Alert and cooperative. Normal mood and affect. General:   Alert,  Well-developed, well-nourished, pleasant and cooperative in NAD Head:  Normocephalic and atraumatic. Eyes:  Sclera clear,  no icterus.   Conjunctiva pink. Ears:  Normal auditory acuity. Nose:  No deformity, discharge, or lesions. Mouth:  No deformity or lesions,oropharynx pink & moist. Neck:  Supple; no masses or thyromegaly. Lungs:  Respirations even and unlabored.  Clear throughout to auscultation.   No wheezes, crackles, or rhonchi. No acute distress. Heart:  Regular rate and rhythm; no murmurs, clicks, rubs, or gallops. Abdomen:  Normal bowel sounds.  No bruits.  Soft, non-tender and non-distended without masses, hepatosplenomegaly or hernias noted.  No guarding or rebound tenderness.    Msk:  Symmetrical without gross deformities. Good, equal movement & strength bilaterally. Pulses:  Normal pulses noted.. Neurologic:  Alert and oriented x3;  grossly normal neurologically. Psych:  Alert and  cooperative. Normal mood and affect.  Imaging Studies: Dg Chest 2 View  Result Date: 08/27/2016 CLINICAL DATA:  Epigastric pain for 3-4 weeks EXAM: CHEST  2 VIEW COMPARISON:  Chest radiograph 11/30/2015 FINDINGS: Cardiomediastinal contours are normal. No pneumothorax or pleural effusion. No focal airspace consolidation or pulmonary edema. IMPRESSION: Clear lungs. Electronically Signed   By: Ulyses Jarred M.D.   On: 08/27/2016 02:10   US Abdomen Limited Ruq  Result Date: 08/27/2016 CLINICAL DATA:  Chronic worsening upper abdominal pain. Initial encounter. EXAM: US ABDOMEN LIMITED - RIGHT UPPER QUADRANT COMPARISON:  None. FINDINGS: Gallbladder: No gallstones or wall thickening visualized. Partially contracted and difficult to fully characterize. No sonographic Murphy sign noted by sonographer. Common bile duct: Diameter: 0.3 cm, within normal limits in caliber. Liver: No focal lesion identified. Within normal limits in parenchymal echogenicity. IMPRESSION: Unremarkable ultrasound of the right upper quadrant. Electronically Signed   By: Garald Balding M.D.   On: 08/27/2016 02:41    Assessment and Plan:   Veronica Montes is a 51 y.o. y/o female has been referred for abdominal pain. She has a history of NSAID usage over the past 6 weeks and may very likely caused an ulcer or NSAID related mucosal injury of her GI tract . She is also due for colorectal cancer screening average risk   Plan  1. EGD+ colonoscopy  2. Stool H pylori antigen  3. Continue protonix 40 mg once daily  4. If endoscopy is negative then will obtain CT scan of abdomen  5. Stop all NSAID's  Follow up in 8 weeks.   Dr Jonathon Bellows MD

## 2016-09-04 ENCOUNTER — Other Ambulatory Visit: Payer: Self-pay

## 2016-09-06 ENCOUNTER — Telehealth: Payer: Self-pay | Admitting: Gastroenterology

## 2016-09-06 ENCOUNTER — Other Ambulatory Visit: Payer: Self-pay

## 2016-09-06 MED ORDER — NA SULFATE-K SULFATE-MG SULF 17.5-3.13-1.6 GM/177ML PO SOLN: 1.0000 | ORAL | 0 refills | Status: DC

## 2016-09-06 NOTE — Telephone Encounter (Signed)
Pt has rescheduled screening colonoscopy/EGD to 09/12/16 at [facility] with Vicente Males. Please precert:  Screening [id number] LUQ abdominal pain R10.12

## 2016-09-06 NOTE — Telephone Encounter (Signed)
Patient is having a colonoscopy and EGD done by Dr Vicente Males on 09/24/16. She would like to have this done much sooner if possible. Please call patient to see this can be moved. [phone].

## 2016-09-09 ENCOUNTER — Ambulatory Visit: Payer: BLUE CROSS/BLUE SHIELD | Admitting: Gastroenterology

## 2016-09-09 ENCOUNTER — Telehealth: Payer: Self-pay | Admitting: Gastroenterology

## 2016-09-09 ENCOUNTER — Other Ambulatory Visit
Admission: RE | Admit: 2016-09-09 | Discharge: 2016-09-09 | Disposition: A | Discharge status: Home / Self Care | Payer: BLUE CROSS/BLUE SHIELD | Source: Ambulatory Visit | Attending: Gastroenterology | Admitting: Gastroenterology

## 2016-09-09 DIAGNOSIS — R1012 Left upper quadrant pain: Secondary | ICD-10-CM | POA: Insufficient documentation

## 2016-09-09 NOTE — Telephone Encounter (Signed)
Patient has called and stated that her insurance will not cover the [place]. Her procedure is scheduled for 10/13/16 with Dr Vicente Males. Please call patient and advise her if you are able to provide her with a sample or if you will be calling in another prescription.   Please call patient at [phone].

## 2016-09-09 NOTE — Telephone Encounter (Signed)
Pt will be provided a sample of suprep as it was $140.

## 2016-09-11 ENCOUNTER — Encounter: Payer: Self-pay | Admitting: *Deleted

## 2016-09-11 LAB — H. PYLORI ANTIGEN, STOOL: H. Pylori Stool Ag, Eia: NEGATIVE

## 2016-09-12 ENCOUNTER — Ambulatory Visit: Payer: BLUE CROSS/BLUE SHIELD | Admitting: Anesthesiology

## 2016-09-12 ENCOUNTER — Encounter
Admission: RE | Disposition: A | Discharge status: Home / Self Care | Payer: Self-pay | Source: Ambulatory Visit | Attending: Gastroenterology

## 2016-09-12 ENCOUNTER — Ambulatory Visit
Admission: RE | Admit: 2016-09-12 | Discharge: 2016-09-12 | Disposition: A | Discharge status: Home / Self Care | Payer: BLUE CROSS/BLUE SHIELD | Source: Ambulatory Visit | Attending: Gastroenterology | Admitting: Gastroenterology

## 2016-09-12 ENCOUNTER — Encounter: Payer: Self-pay | Admitting: *Deleted

## 2016-09-12 DIAGNOSIS — Z79899 Other long term (current) drug therapy: Secondary | ICD-10-CM | POA: Diagnosis not present

## 2016-09-12 DIAGNOSIS — K219 Gastro-esophageal reflux disease without esophagitis: Secondary | ICD-10-CM | POA: Insufficient documentation

## 2016-09-12 DIAGNOSIS — R1012 Left upper quadrant pain: Secondary | ICD-10-CM | POA: Diagnosis not present

## 2016-09-12 DIAGNOSIS — Z85820 Personal history of malignant melanoma of skin: Secondary | ICD-10-CM | POA: Insufficient documentation

## 2016-09-12 DIAGNOSIS — K2950 Unspecified chronic gastritis without bleeding: Secondary | ICD-10-CM | POA: Insufficient documentation

## 2016-09-12 DIAGNOSIS — Z1211 Encounter for screening for malignant neoplasm of colon: Secondary | ICD-10-CM

## 2016-09-12 DIAGNOSIS — F419 Anxiety disorder, unspecified: Secondary | ICD-10-CM | POA: Insufficient documentation

## 2016-09-12 HISTORY — DX: Gastro-esophageal reflux disease without esophagitis: K21.9

## 2016-09-12 HISTORY — PX: COLONOSCOPY WITH PROPOFOL: SHX5780

## 2016-09-12 HISTORY — PX: ESOPHAGOGASTRODUODENOSCOPY (EGD) WITH PROPOFOL: SHX5813

## 2016-09-12 SURGERY — COLONOSCOPY WITH PROPOFOL
Anesthesia: General

## 2016-09-12 MED ORDER — PROPOFOL 500 MG/50ML IV EMUL
INTRAVENOUS | Status: AC
  Filled 2016-09-12: qty 50

## 2016-09-12 MED ORDER — LIDOCAINE HCL (CARDIAC) 20 MG/ML IV SOLN
INTRAVENOUS | Status: DC | PRN
  Administered 2016-09-12: 30 mg via INTRAVENOUS

## 2016-09-12 MED ORDER — FENTANYL CITRATE (PF) 100 MCG/2ML IJ SOLN
INTRAMUSCULAR | Status: DC | PRN
  Administered 2016-09-12: 50 ug via INTRAVENOUS

## 2016-09-12 MED ORDER — SODIUM CHLORIDE 0.9 % IV SOLN
INTRAVENOUS | Status: DC
  Administered 2016-09-12: 11:00:00 via INTRAVENOUS

## 2016-09-12 MED ORDER — PROPOFOL 500 MG/50ML IV EMUL
INTRAVENOUS | Status: DC | PRN
  Administered 2016-09-12: 120 ug/kg/min via INTRAVENOUS

## 2016-09-12 MED ORDER — LIDOCAINE HCL (PF) 2 % IJ SOLN
INTRAMUSCULAR | Status: AC
  Filled 2016-09-12: qty 2

## 2016-09-12 MED ORDER — GLYCOPYRROLATE 0.2 MG/ML IJ SOLN
INTRAMUSCULAR | Status: AC
  Filled 2016-09-12: qty 1

## 2016-09-12 MED ORDER — MIDAZOLAM HCL 2 MG/2ML IJ SOLN
INTRAMUSCULAR | Status: DC | PRN
  Administered 2016-09-12: 1 mg via INTRAVENOUS

## 2016-09-12 MED ORDER — MIDAZOLAM HCL 2 MG/2ML IJ SOLN
INTRAMUSCULAR | Status: AC
  Filled 2016-09-12: qty 2

## 2016-09-12 MED ORDER — FENTANYL CITRATE (PF) 100 MCG/2ML IJ SOLN
INTRAMUSCULAR | Status: AC
  Filled 2016-09-12: qty 2

## 2016-09-12 NOTE — Anesthesia Postprocedure Evaluation (Signed)
Anesthesia Post Note  Patient: Veronica Montes  Procedure(s) Performed: Procedure(s) (LRB): COLONOSCOPY WITH PROPOFOL (N/A) ESOPHAGOGASTRODUODENOSCOPY (EGD) WITH PROPOFOL (N/A)  Patient location during evaluation: Endoscopy Anesthesia Type: General Level of consciousness: awake and alert Pain management: pain level controlled Vital Signs Assessment: post-procedure vital signs reviewed and stable Respiratory status: spontaneous breathing, nonlabored ventilation, respiratory function stable and patient connected to nasal cannula oxygen Cardiovascular status: blood pressure returned to baseline and stable Postop Assessment: no signs of nausea or vomiting Anesthetic complications: no     Last Vitals:  Vitals:   09/12/16 1150 09/12/16 1200  BP: 90/68 108/73  Pulse: 79 71  Resp: 12 19  Temp:      Last Pain:  Vitals:   09/12/16 1130  TempSrc: Tympanic                 Precious Haws Cullin Dishman

## 2016-09-12 NOTE — Anesthesia Preprocedure Evaluation (Signed)
Anesthesia Evaluation  Patient identified by MRN, date of birth, ID band Patient awake    Reviewed: Allergy & Precautions, H&P , NPO status , Patient's Chart, lab work & pertinent test results  History of Anesthesia Complications Negative for: history of anesthetic complications  Airway Mallampati: II  TM Distance: >3 FB Neck ROM: full    Dental  (+) Poor Dentition, Chipped   Pulmonary neg pulmonary ROS, neg shortness of breath,    Pulmonary exam normal breath sounds clear to auscultation       Cardiovascular Exercise Tolerance: Good (-) angina(-) Past MI and (-) DOE negative cardio ROS Normal cardiovascular exam Rhythm:regular Rate:Normal     Neuro/Psych negative neurological ROS  negative psych ROS   GI/Hepatic Neg liver ROS, GERD  Controlled and Medicated,  Endo/Other  negative endocrine ROS  Renal/GU negative Renal ROS  negative genitourinary   Musculoskeletal   Abdominal   Peds  Hematology negative hematology ROS (+)   Anesthesia Other Findings Past Medical History: No date: Anxiety No date: Cancer [facility])     Comment: melanoma No date: GERD (gastroesophageal reflux disease)  Past Surgical History: 2002: ABDOMINAL HYSTERECTOMY 10+yrs ago: AUGMENTATION MAMMAPLASTY Bilateral 2014: BREAST BIOPSY Left     Comment: bx/clip-neg No date: TUBAL LIGATION  BMI    Body Mass Index:  23.56 kg/m      Reproductive/Obstetrics negative OB ROS                             Anesthesia Physical Anesthesia Plan  ASA: III  Anesthesia Plan: General   Post-op Pain Management:    Induction:   Airway Management Planned:   Additional Equipment:   Intra-op Plan:   Post-operative Plan:   Informed Consent: I have reviewed the patients History and Physical, chart, labs and discussed the procedure including the risks, benefits and alternatives for the proposed anesthesia with the patient or  authorized representative who has indicated his/her understanding and acceptance.   Dental Advisory Given  Plan Discussed with: Anesthesiologist, CRNA and Surgeon  Anesthesia Plan Comments:         Anesthesia Quick Evaluation

## 2016-09-12 NOTE — Anesthesia Post-op Follow-up Note (Cosign Needed)
Anesthesia QCDR form completed.        

## 2016-09-12 NOTE — Op Note (Signed)
[facility] Gastroenterology Patient Name: Veronica Montes Procedure Date: 09/12/2016 10:56 AM MRN: [id number] Account #: [id number] Date of Birth: November 11, 1965 Admit Type: Outpatient Age: 51 Room: [facility] ENDO ROOM 4 Gender: Female Note Status: Finalized Procedure:            Colonoscopy Indications:          Screening for colorectal malignant neoplasm Providers:            Jonathon Bellows MD, MD Referring MD:         Leonie Douglas. Doy Hutching, MD (Referring MD) Medicines:            Monitored Anesthesia Care Complications:        No immediate complications. Procedure:            Pre-Anesthesia Assessment:                       - Prior to the procedure, a History and Physical was                        performed, and patient medications, allergies and                        sensitivities were reviewed. The patient's tolerance of                        previous anesthesia was reviewed.                       - The risks and benefits of the procedure and the                        sedation options and risks were discussed with the                        patient. All questions were answered and informed                        consent was obtained.                       - The risks and benefits of the procedure and the                        sedation options and risks were discussed with the                        patient. All questions were answered and informed                        consent was obtained.                       - ASA Grade Assessment: III - A patient with severe                        systemic disease.                       After obtaining informed consent, the colonoscope was  passed under direct vision. Throughout the procedure,                        the patient's blood pressure, pulse, and oxygen                        saturations were monitored continuously. The                        Colonoscope was introduced through the anus and                  advanced to the the cecum, identified by the                        appendiceal orifice, IC valve and transillumination.                        The colonoscopy was performed with ease. The patient                        tolerated the procedure well. The quality of the bowel                        preparation was excellent. Findings:      The perianal and digital rectal examinations were normal.      The colon (entire examined portion) appeared normal.      The entire examined colon appeared normal on direct and retroflexion       views. Impression:           - The entire examined colon is normal.                       - The entire examined colon is normal on direct and                        retroflexion views.                       - No specimens collected. Recommendation:       - Discharge patient to home (with escort).                       - Resume previous diet.                       - Continue present medications.                       - Repeat colonoscopy in 10 years for screening purposes.                       - Return to my office as previously scheduled.                       - Ct abdomen and pelvis as an outpatient to further                        evaluate the abdominal pain. Procedure Code(s):    --- Professional ---  [id number], Colorectal cancer screening; colonoscopy on                        individual not meeting criteria for high risk Diagnosis Code(s):    --- Professional ---                       Z12.11, Encounter for screening for malignant neoplasm                        of colon CPT copyright 2016 American Medical Association. All rights reserved. The codes documented in this report are preliminary and upon coder review may  be revised to meet current compliance requirements. Jonathon Bellows, MD Jonathon Bellows MD, MD 09/12/2016 11:35:26 AM This report has been signed electronically. Number of Addenda: 0 Note Initiated On: 09/12/2016  10:56 AM Scope Withdrawal Time: 0 hours 10 minutes 18 seconds  Total Procedure Duration: 0 hours 12 minutes 50 seconds       [facility]

## 2016-09-12 NOTE — Anesthesia Procedure Notes (Signed)
Performed by: COOK-MARTIN, Jesika Men Pre-anesthesia Checklist: Patient identified, Suction available, Emergency Drugs available, Patient being monitored and Timeout performed Patient Re-evaluated:Patient Re-evaluated prior to inductionOxygen Delivery Method: Nasal cannula Preoxygenation: Pre-oxygenation with 100% oxygen Intubation Type: IV induction Airway Equipment and Method: Bite block Placement Confirmation: positive ETCO2 and CO2 detector     

## 2016-09-12 NOTE — H&P (Signed)
  Jonathon Bellows MD [address], [place], [place] [address] Phone: [phone] Fax : [phone]  Primary Care Physician:  Idelle Crouch, MD Primary Gastroenterologist:  Dr. Jonathon Bellows   Pre-Procedure History & Physical: HPI:  Veronica Montes is a 51 y.o. female is here for an endoscopy and colonoscopy.   Past Medical History:  Diagnosis Date  . Anxiety   . Cancer ([place])    melanoma  . GERD (gastroesophageal reflux disease)     Past Surgical History:  Procedure Laterality Date  . ABDOMINAL HYSTERECTOMY  2002  . AUGMENTATION MAMMAPLASTY Bilateral 10+yrs ago  . BREAST BIOPSY Left 2014   bx/clip-neg  . TUBAL LIGATION      Prior to Admission medications   Medication Sig Start Date End Date Taking? Authorizing Provider  LORazepam (ATIVAN) 1 MG tablet Take 1 to 2 tablets at bedtime as needed 04/14/15  Yes Richard Maceo Pro., MD  Na Sulfate-K Sulfate-Mg Sulf (SUPREP BOWEL PREP KIT) 17.5-3.13-1.6 GM/180ML SOLN Take 1 kit by mouth as directed. 09/06/16  Yes Lucilla Lame, MD  pantoprazole (PROTONIX) 40 MG tablet Take by mouth. 06/17/16 06/17/17 Yes Historical Provider, MD  ranitidine (ZANTAC) 300 MG tablet Take by mouth. 06/17/16 06/17/17 Yes Historical Provider, MD  TraZODone HCl 150 MG TB24 Take by mouth.   Yes Historical Provider, MD  traZODone (DESYREL) 150 MG tablet  07/26/16   Historical Provider, MD    Allergies as of 09/04/2016  . (No Known Allergies)    Family History  Problem Relation Age of Onset  . Heart disease Brother   . Breast cancer Neg Hx     Social History   Social History  . Marital status: Married    Spouse name: N/A  . Number of children: N/A  . Years of education: N/A   Occupational History  . Not on file.   Social History Main Topics  . Smoking status: Never Smoker  . Smokeless tobacco: Never Used  . Alcohol use No  . Drug use: No  . Sexual activity: Yes   Other Topics Concern  . Not on file   Social History Narrative  . No  narrative on file    Review of Systems: See HPI, otherwise negative ROS  Physical Exam: BP (!) 128/98   Pulse 89   Temp 97.7 F (36.5 C) (Tympanic)   Resp 14   Ht '5\' 3"'$  (1.6 m)   Wt 133 lb (60.3 kg)   SpO2 98%   BMI 23.56 kg/m  General:   Alert,  pleasant and cooperative in NAD Head:  Normocephalic and atraumatic. Neck:  Supple; no masses or thyromegaly. Lungs:  Clear throughout to auscultation.    Heart:  Regular rate and rhythm. Abdomen:  Soft, nontender and nondistended. Normal bowel sounds, without guarding, and without rebound.   Neurologic:  Alert and  oriented x4;  grossly normal neurologically.  Impression/Plan: Veronica Montes is here for an endoscopy and colonoscopy to be performed for abdominal  Pain and coloretcal cancer screening   Risks, benefits, limitations, and alternatives regarding  endoscopy and colonoscopy have been reviewed with the patient.  Questions have been answered.  All parties agreeable.   Jonathon Bellows, MD  09/12/2016, 10:51 AM

## 2016-09-12 NOTE — Op Note (Signed)
[facility] Gastroenterology Patient Name: Veronica Montes Procedure Date: 09/12/2016 10:57 AM MRN: [id number] Account #: [id number] Date of Birth: 1965-10-31 Admit Type: Outpatient Age: 51 Room: [facility] ENDO ROOM 4 Gender: Female Note Status: Finalized Procedure:            Upper GI endoscopy Indications:          Abdominal pain in the left upper quadrant Providers:            Jonathon Bellows MD, MD Medicines:            Monitored Anesthesia Care Complications:        No immediate complications. Procedure:            Pre-Anesthesia Assessment:                       - Prior to the procedure, a History and Physical was                        performed, and patient medications, allergies and                        sensitivities were reviewed. The patient's tolerance of                        previous anesthesia was reviewed.                       - The risks and benefits of the procedure and the                        sedation options and risks were discussed with the                        patient. All questions were answered and informed                        consent was obtained.                       - The risks and benefits of the procedure and the                        sedation options and risks were discussed with the                        patient. All questions were answered and informed                        consent was obtained.                       - ASA Grade Assessment: III - A patient with severe                        systemic disease.                       After obtaining informed consent, the endoscope was                        passed under direct vision. Throughout the procedure,  the patient's blood pressure, pulse, and oxygen                        saturations were monitored continuously. The Endoscope                        was introduced through the mouth, and advanced to the                        third part of duodenum.  The upper GI endoscopy was                        accomplished with ease. The patient tolerated the                        procedure well. Findings:      The examined duodenum was normal.      The esophagus was normal.      The entire examined stomach was normal. Biopsies were taken with a cold       forceps for histology. Impression:           - Normal examined duodenum.                       - Normal esophagus.                       - Normal stomach. Biopsied. Recommendation:       - Discharge patient to home (with escort).                       - Resume previous diet.                       - Continue present medications.                       - Await pathology results.                       - Return to my office as previously scheduled. Procedure Code(s):    --- Professional ---                       [phone], Esophagogastroduodenoscopy, flexible, transoral;                        with biopsy, single or multiple Diagnosis Code(s):    --- Professional ---                       R10.12, Left upper quadrant pain CPT copyright 2016 American Medical Association. All rights reserved. The codes documented in this report are preliminary and upon coder review may  be revised to meet current compliance requirements. Jonathon Bellows, MD Jonathon Bellows MD, MD 09/12/2016 11:11:20 AM This report has been signed electronically. Number of Addenda: 0 Note Initiated On: 09/12/2016 10:57 AM      [facility]

## 2016-09-12 NOTE — Transfer of Care (Signed)
Immediate Anesthesia Transfer of Care Note  Patient: Veronica Montes  Procedure(s) Performed: Procedure(s): COLONOSCOPY WITH PROPOFOL (N/A) ESOPHAGOGASTRODUODENOSCOPY (EGD) WITH PROPOFOL (N/A)  Patient Location: PACU  Anesthesia Type:General  Level of Consciousness: awake and sedated  Airway & Oxygen Therapy: Patient Spontanous Breathing and Patient connected to nasal cannula oxygen  Post-op Assessment: Report given to RN and Post -op Vital signs reviewed and stable  Post vital signs: Reviewed and stable  Last Vitals:  Vitals:   09/12/16 1019 09/12/16 1130  BP: (!) 128/98 (!) 97/59  Pulse: 89 (!) 102  Resp: 14 13  Temp: 36.5 C 36.1 C    Last Pain:  Vitals:   09/12/16 1130  TempSrc: Tympanic         Complications: No apparent anesthesia complications

## 2016-09-13 ENCOUNTER — Encounter: Payer: Self-pay | Admitting: Gastroenterology

## 2016-09-13 LAB — SURGICAL PATHOLOGY

## 2016-09-16 ENCOUNTER — Encounter: Payer: Self-pay | Admitting: Gastroenterology

## 2016-09-19 ENCOUNTER — Other Ambulatory Visit: Payer: Self-pay

## 2016-09-19 DIAGNOSIS — R1084 Generalized abdominal pain: Secondary | ICD-10-CM

## 2016-09-27 ENCOUNTER — Other Ambulatory Visit: Payer: Self-pay

## 2016-09-27 DIAGNOSIS — R1011 Right upper quadrant pain: Secondary | ICD-10-CM

## 2016-09-30 ENCOUNTER — Ambulatory Visit: Admission: RE | Admit: 2016-09-30 | Payer: BLUE CROSS/BLUE SHIELD | Source: Ambulatory Visit

## 2016-10-07 ENCOUNTER — Ambulatory Visit
Admission: RE | Admit: 2016-10-07 | Discharge: 2016-10-07 | Disposition: A | Discharge status: Home / Self Care | Payer: BLUE CROSS/BLUE SHIELD | Source: Ambulatory Visit | Attending: Gastroenterology | Admitting: Gastroenterology

## 2016-10-07 ENCOUNTER — Ambulatory Visit: Payer: BLUE CROSS/BLUE SHIELD

## 2016-10-07 DIAGNOSIS — R1011 Right upper quadrant pain: Secondary | ICD-10-CM | POA: Diagnosis not present

## 2016-10-07 DIAGNOSIS — R1012 Left upper quadrant pain: Secondary | ICD-10-CM | POA: Insufficient documentation

## 2016-10-07 HISTORY — DX: Malignant melanoma of skin, unspecified: C43.9

## 2016-10-07 MED ORDER — IOPAMIDOL (ISOVUE-300) INJECTION 61%
85.0000 mL | Freq: Once | INTRAVENOUS | Status: AC | PRN
  Administered 2016-10-07: 85 mL via INTRAVENOUS

## 2016-10-08 ENCOUNTER — Other Ambulatory Visit: Payer: Self-pay

## 2016-10-08 ENCOUNTER — Telehealth: Payer: Self-pay

## 2016-10-08 DIAGNOSIS — F419 Anxiety disorder, unspecified: Secondary | ICD-10-CM | POA: Insufficient documentation

## 2016-10-08 DIAGNOSIS — R9389 Abnormal findings on diagnostic imaging of other specified body structures: Secondary | ICD-10-CM

## 2016-10-08 DIAGNOSIS — C439 Malignant melanoma of skin, unspecified: Secondary | ICD-10-CM | POA: Insufficient documentation

## 2016-10-08 NOTE — Telephone Encounter (Signed)
-----   Message from [place]ie [place], [place] sent at 10/08/2016 11:24 AM EST -----   ----- Message ----- From: Jonathon Bellows, MD Sent: 10/08/2016  11:02 AM To: Glennie Isle, CMA  Normal Ct abdomen

## 2016-10-08 NOTE — Telephone Encounter (Signed)
Ginger spoke with patient concerning results and need for scheduling pelvic ct w/ contrast. Advised of appointment for pelvic (10/15/2016) and with [facility] (10/18/2016).

## 2016-10-15 ENCOUNTER — Ambulatory Visit
Admission: RE | Admit: 2016-10-15 | Discharge: 2016-10-15 | Disposition: A | Discharge status: Home / Self Care | Payer: BLUE CROSS/BLUE SHIELD | Source: Ambulatory Visit | Attending: Gastroenterology | Admitting: Gastroenterology

## 2016-10-15 DIAGNOSIS — R938 Abnormal findings on diagnostic imaging of other specified body structures: Secondary | ICD-10-CM | POA: Diagnosis present

## 2016-10-15 DIAGNOSIS — R9389 Abnormal findings on diagnostic imaging of other specified body structures: Secondary | ICD-10-CM

## 2016-10-15 MED ORDER — IOPAMIDOL (ISOVUE-300) INJECTION 61%
100.0000 mL | Freq: Once | INTRAVENOUS | Status: AC | PRN
  Administered 2016-10-15: 100 mL via INTRAVENOUS

## 2016-10-16 ENCOUNTER — Telehealth: Payer: Self-pay

## 2016-10-16 NOTE — Telephone Encounter (Signed)
Spoke with Dr. Vicente Males. He advised the patient keep her Urology appointment.   Advised pt to keep appointment for the purpose of prevention information, per Dr. Vicente Males.

## 2016-10-18 ENCOUNTER — Ambulatory Visit: Payer: BLUE CROSS/BLUE SHIELD | Admitting: Urology

## 2017-01-08 ENCOUNTER — Other Ambulatory Visit: Payer: Self-pay | Admitting: Obstetrics and Gynecology

## 2017-01-08 ENCOUNTER — Encounter: Payer: Self-pay | Admitting: Obstetrics and Gynecology

## 2017-01-08 ENCOUNTER — Ambulatory Visit (INDEPENDENT_AMBULATORY_CARE_PROVIDER_SITE_OTHER): Payer: BLUE CROSS/BLUE SHIELD | Admitting: Obstetrics and Gynecology

## 2017-01-08 ENCOUNTER — Encounter: Payer: BLUE CROSS/BLUE SHIELD | Admitting: Obstetrics and Gynecology

## 2017-01-08 VITALS — BP 126/80 | HR 85 | Ht 63.0 in | Wt 133.3 lb

## 2017-01-08 DIAGNOSIS — Z01419 Encounter for gynecological examination (general) (routine) without abnormal findings: Secondary | ICD-10-CM

## 2017-01-08 NOTE — Progress Notes (Signed)
Subjective:   Veronica Montes is a 51 y.o. G37P0 Caucasian female here for a routine well-woman exam.  No LMP recorded. Patient has had a hysterectomy.    Current complaints: none PCP: [facility]       does desire labs  Social History: Sexual: heterosexual Marital Status: married Living situation: with family Occupation: hairdresser Tobacco/alcohol: no tobacco use Illicit drugs: no history of illicit drug use  The following portions of the patient's history were reviewed and updated as appropriate: allergies, current medications, past family history, past medical history, past social history, past surgical history and problem list.  Past Medical History Past Medical History:  Diagnosis Date  . Anxiety   . GERD (gastroesophageal reflux disease)   . Melanoma ([place]) 2011   Resected from Right upper chest area.    Past Surgical History Past Surgical History:  Procedure Laterality Date  . ABDOMINAL HYSTERECTOMY  2002  . AUGMENTATION MAMMAPLASTY Bilateral 10+yrs ago  . BREAST BIOPSY Left 2014   bx/clip-neg  . COLONOSCOPY WITH PROPOFOL N/A 09/12/2016   Procedure: COLONOSCOPY WITH PROPOFOL;  Surgeon: Jonathon Bellows, MD;  Location: [facility] ENDOSCOPY;  Service: Endoscopy;  Laterality: N/A;  . ESOPHAGOGASTRODUODENOSCOPY (EGD) WITH PROPOFOL N/A 09/12/2016   Procedure: ESOPHAGOGASTRODUODENOSCOPY (EGD) WITH PROPOFOL;  Surgeon: Jonathon Bellows, MD;  Location: [facility] ENDOSCOPY;  Service: Endoscopy;  Laterality: N/A;  . TUBAL LIGATION      Gynecologic History G2P0  No LMP recorded. Patient has had a hysterectomy. Contraception: post menopausal status Last Pap: 2015. Results were: normal Last mammogram: 2017. Results were: normal   Obstetric History OB History  Gravida Para Term Preterm AB Living  2         1  SAB TAB Ectopic Multiple Live Births          1    # Outcome Date GA Lbr Len/2nd Weight Sex Delivery Anes PTL Lv  2 Gravida 1997    F Vag-Spont     1 Gravida 1991    F Vag-Spont   LIV       Current Medications Current Outpatient Prescriptions on File Prior to Visit  Medication Sig Dispense Refill  . LORazepam (ATIVAN) 1 MG tablet Take 1 to 2 tablets at bedtime as needed 60 tablet 0  . traZODone (DESYREL) 150 MG tablet   6  . TraZODone HCl 150 MG TB24 Take by mouth.     No current facility-administered medications on file prior to visit.     Review of Systems Patient denies any headaches, blurred vision, shortness of breath, chest pain, abdominal pain, problems with bowel movements, urination, or intercourse.  Objective:  BP 126/80   Pulse 85   Ht 5\' 3"  (1.6 m)   Wt 133 lb 4.8 oz (60.5 kg)   BMI 23.61 kg/m  Physical Exam  General:  Well developed, well nourished, no acute distress. She is alert and oriented x3. Skin:  Warm and dry Neck:  Midline trachea, no thyromegaly or nodules Cardiovascular: Regular rate and rhythm, no murmur heard Lungs:  Effort normal, all lung fields clear to auscultation bilaterally Breasts:  No dominant palpable mass, retraction, or nipple discharge, Bilateral implants noted without defect. Abdomen:  Soft, non tender, no hepatosplenomegaly or masses Pelvic:  External genitalia is normal in appearance.  The vagina is normal in appearance. The cervix is bulbous, no CMT.  Thin prep pap is done with HR HPV cotesting. Uterus is surgically absent.  No adnexal masses or tenderness noted.  Extremities:  No swelling or varicosities  noted Psych:  She has a normal mood and affect  Assessment:   Healthy well-woman exam  Plan:  Labs obtained F/U 1 year for AE, or sooner if needed Mammogram ordered or sooner if problems   Melody Rockney Ghee, CNM

## 2017-01-09 LAB — COMPREHENSIVE METABOLIC PANEL
A/G RATIO: 1.6 (ref 1.2–2.2)
ALT: 10 IU/L (ref 0–32)
AST: 11 IU/L (ref 0–40)
Albumin: 4.1 g/dL (ref 3.5–5.5)
Alkaline Phosphatase: 51 IU/L (ref 39–117)
BUN/Creatinine Ratio: 19 (ref 9–23)
BUN: 17 mg/dL (ref 6–24)
Bilirubin Total: 0.3 mg/dL (ref 0.0–1.2)
CALCIUM: 9.5 mg/dL (ref 8.7–10.2)
CHLORIDE: 102 mmol/L (ref 96–106)
CO2: 24 mmol/L (ref 18–29)
Creatinine, Ser: 0.88 mg/dL (ref 0.57–1.00)
GFR calc Af Amer: 89 mL/min/{1.73_m2} (ref >59)
GFR, EST NON AFRICAN AMERICAN: 77 mL/min/{1.73_m2} (ref >59)
GLUCOSE: 88 mg/dL (ref 65–99)
Globulin, Total: 2.6 g/dL (ref 1.5–4.5)
POTASSIUM: 3.9 mmol/L (ref 3.5–5.2)
Sodium: 137 mmol/L (ref 134–144)
Total Protein: 6.7 g/dL (ref 6.0–8.5)

## 2017-01-09 LAB — LIPID PANEL
CHOL/HDL RATIO: 3.2 ratio (ref 0.0–4.4)
Cholesterol, Total: 185 mg/dL (ref 100–199)
HDL: 57 mg/dL (ref >39)
LDL Calculated: 119 mg/dL — ABNORMAL HIGH (ref 0–99)
Triglycerides: 44 mg/dL (ref 0–149)
VLDL Cholesterol Cal: 9 mg/dL (ref 5–40)

## 2017-01-09 LAB — TSH: TSH: 2.21 u[IU]/mL (ref 0.450–4.500)

## 2017-01-09 LAB — FOLLICLE STIMULATING HORMONE: FSH: 16.5 m[IU]/mL

## 2017-01-10 LAB — CYTOLOGY - PAP

## 2017-05-27 ENCOUNTER — Other Ambulatory Visit: Payer: Self-pay | Admitting: Internal Medicine

## 2017-05-27 DIAGNOSIS — Z1231 Encounter for screening mammogram for malignant neoplasm of breast: Secondary | ICD-10-CM

## 2017-06-18 ENCOUNTER — Other Ambulatory Visit: Payer: Self-pay | Admitting: Orthopedic Surgery

## 2017-06-18 DIAGNOSIS — M7542 Impingement syndrome of left shoulder: Secondary | ICD-10-CM

## 2017-06-26 ENCOUNTER — Ambulatory Visit
Admission: RE | Admit: 2017-06-26 | Discharge: 2017-06-26 | Disposition: A | Discharge status: Home / Self Care | Payer: BLUE CROSS/BLUE SHIELD | Source: Ambulatory Visit | Attending: Orthopedic Surgery | Admitting: Orthopedic Surgery

## 2017-06-26 DIAGNOSIS — M65812 Other synovitis and tenosynovitis, left shoulder: Secondary | ICD-10-CM | POA: Diagnosis not present

## 2017-06-26 DIAGNOSIS — M7542 Impingement syndrome of left shoulder: Secondary | ICD-10-CM | POA: Insufficient documentation

## 2017-06-26 MED ORDER — GADOBENATE DIMEGLUMINE 529 MG/ML IV SOLN
0.1000 mL | Freq: Once | INTRAVENOUS | Status: AC | PRN
  Administered 2017-06-26: 0.1 mL via INTRA_ARTICULAR

## 2017-06-26 MED ORDER — SODIUM CHLORIDE 0.9 % IJ SOLN
20.0000 mL | INTRAMUSCULAR | Status: DC | PRN
  Administered 2017-06-26: 20 mL
  Filled 2017-06-26: qty 20

## 2017-06-26 MED ORDER — IOPAMIDOL (ISOVUE-200) INJECTION 41%
10.0000 mL | Freq: Once | INTRAVENOUS | Status: AC | PRN
  Administered 2017-06-26: 10 mL
  Filled 2017-06-26: qty 50

## 2017-06-26 MED ORDER — LIDOCAINE HCL (PF) 1 % IJ SOLN
5.0000 mL | Freq: Once | INTRAMUSCULAR | Status: AC
  Administered 2017-06-26: 5 mL
  Filled 2017-06-26: qty 5

## 2017-06-30 ENCOUNTER — Ambulatory Visit
Admission: RE | Admit: 2017-06-30 | Discharge: 2017-06-30 | Disposition: A | Discharge status: Home / Self Care | Payer: BLUE CROSS/BLUE SHIELD | Source: Ambulatory Visit | Attending: Internal Medicine | Admitting: Internal Medicine

## 2017-06-30 DIAGNOSIS — Z1231 Encounter for screening mammogram for malignant neoplasm of breast: Secondary | ICD-10-CM | POA: Insufficient documentation

## 2017-07-07 ENCOUNTER — Other Ambulatory Visit: Payer: Self-pay | Admitting: *Deleted

## 2017-07-07 MED ORDER — CEFDINIR 300 MG PO CAPS: 300.0000 mg | ORAL_CAPSULE | Freq: Two times a day (BID) | ORAL | 0 refills | Status: DC

## 2017-08-01 ENCOUNTER — Other Ambulatory Visit: Payer: Self-pay | Admitting: *Deleted

## 2017-08-01 MED ORDER — AZITHROMYCIN 250 MG PO TABS: 250.0000 mg | ORAL_TABLET | Freq: Once | ORAL | 0 refills | Status: AC

## 2017-08-07 ENCOUNTER — Other Ambulatory Visit: Payer: Self-pay | Admitting: Orthopedic Surgery

## 2017-08-08 ENCOUNTER — Other Ambulatory Visit: Payer: Self-pay

## 2017-08-08 ENCOUNTER — Encounter
Admission: RE | Admit: 2017-08-08 | Discharge: 2017-08-08 | Disposition: A | Discharge status: Home / Self Care | Payer: BLUE CROSS/BLUE SHIELD | Source: Ambulatory Visit | Attending: Orthopedic Surgery | Admitting: Orthopedic Surgery

## 2017-08-08 ENCOUNTER — Encounter: Payer: Self-pay | Admitting: *Deleted

## 2017-08-08 NOTE — Patient Instructions (Signed)
Your procedure is scheduled on: 08-15-17 FRIDAY Report to Same Day Surgery 2nd floor medical mall [facility] Entrance-take elevator on left to 2nd floor.  Check in with surgery information desk.) To find out your arrival time please call [phone] between 1PM - 3PM on 08-04-17 THURSDAY  Remember: Instructions that are not followed completely may result in serious medical risk, up to and including death, or upon the discretion of your surgeon and anesthesiologist your surgery may need to be rescheduled.    _x___ 1. Do not eat food after midnight the night before your procedure. NO GUM OR CANDY AFTER MIDNIGHT.  You may drink clear liquids up to 2 hours before you are scheduled to arrive at the hospital for your procedure.  Do not drink clear liquids within 2 hours of your scheduled arrival to the hospital.  Clear liquids include  --Water or Apple juice without pulp  --Clear carbohydrate beverage such as ClearFast or Gatorade  --Black Coffee or Clear Tea (No milk, no creamers, do not add anything to the coffee or Tea      __x__ 2. No Alcohol for 24 hours before or after surgery.   __x__3. No Smoking for 24 prior to surgery.   ____  4. Bring all medications with you on the day of surgery if instructed.    __x__ 5. Notify your doctor if there is any change in your medical condition     (cold, fever, infections).     Do not wear jewelry, make-up, hairpins, clips or nail polish.  Do not wear lotions, powders, or perfumes. You may wear deodorant.  Do not shave 48 hours prior to surgery. Men may shave face and neck.  Do not bring valuables to the hospital.    [facility] is not responsible for any belongings or valuables.               Contacts, dentures or bridgework may not be worn into surgery.  Leave your suitcase in the car. After surgery it may be brought to your room.  For patients admitted to the hospital, discharge time is determined by your treatment team.   Patients  discharged the day of surgery will not be allowed to drive home.  You will need someone to drive you home and stay with you the night of your procedure.    Please read over the following fact sheets that you were given:   [facility] Preparing for Surgery and or MRSA Information   _x___ TAKE THE FOLLOWING MEDICATION THE MORNING OF SURGERY WITH A SMALL SIP OF WATER. These include:  1. YOU MAY TAKE ATIVAN AM OF SURGERY IF NEEDED  2.  3.  4.  5.  6.  ____Fleets enema or Magnesium Citrate as directed.   _x___ Use CHG Soap or sage wipes as directed on instruction sheet   ____ Use inhalers on the day of surgery and bring to hospital day of surgery  ____ Stop Metformin and Janumet 2 days prior to surgery.    ____ Take 1/2 of usual insulin dose the night before surgery and none on the morning surgery.   ____ Follow recommendations from Cardiologist, Pulmonologist or PCP regarding stopping Aspirin, Coumadin, Plavix ,Eliquis, Effient, or Pradaxa, and Pletal.  X____Stop Anti-inflammatories such as Advil, Aleve, Ibuprofen, Motrin, Naproxen, Naprosyn, Goodies powders or aspirin products-OK to take Tylenol-PT STOPPED IBUPROFEN ON 08-07-17   ____ Stop supplements until after surgery.     ____ Bring C-Pap to the hospital.

## 2017-08-13 ENCOUNTER — Encounter
Admission: RE | Admit: 2017-08-13 | Discharge: 2017-08-13 | Disposition: A | Discharge status: Home / Self Care | Payer: BLUE CROSS/BLUE SHIELD | Source: Ambulatory Visit | Attending: Orthopedic Surgery | Admitting: Orthopedic Surgery

## 2017-08-13 DIAGNOSIS — M1990 Unspecified osteoarthritis, unspecified site: Secondary | ICD-10-CM | POA: Diagnosis not present

## 2017-08-13 DIAGNOSIS — F419 Anxiety disorder, unspecified: Secondary | ICD-10-CM | POA: Diagnosis not present

## 2017-08-13 DIAGNOSIS — Z87891 Personal history of nicotine dependence: Secondary | ICD-10-CM | POA: Diagnosis not present

## 2017-08-13 DIAGNOSIS — M75112 Incomplete rotator cuff tear or rupture of left shoulder, not specified as traumatic: Secondary | ICD-10-CM | POA: Diagnosis not present

## 2017-08-13 DIAGNOSIS — M7552 Bursitis of left shoulder: Secondary | ICD-10-CM | POA: Diagnosis not present

## 2017-08-13 DIAGNOSIS — Z85820 Personal history of malignant melanoma of skin: Secondary | ICD-10-CM | POA: Diagnosis not present

## 2017-08-13 DIAGNOSIS — Z79899 Other long term (current) drug therapy: Secondary | ICD-10-CM | POA: Diagnosis not present

## 2017-08-13 DIAGNOSIS — M7542 Impingement syndrome of left shoulder: Secondary | ICD-10-CM | POA: Diagnosis not present

## 2017-08-13 LAB — CBC WITH DIFFERENTIAL/PLATELET
Basophils Absolute: 0.1 10*3/uL (ref 0–0.1)
Basophils Relative: 1 %
EOS ABS: 0.2 10*3/uL (ref 0–0.7)
Eosinophils Relative: 3 %
HEMATOCRIT: 39.2 % (ref 35.0–47.0)
HEMOGLOBIN: 13.2 g/dL (ref 12.0–16.0)
LYMPHS PCT: 23 %
Lymphs Abs: 1.7 10*3/uL (ref 1.0–3.6)
MCH: 28.6 pg (ref 26.0–34.0)
MCHC: 33.6 g/dL (ref 32.0–36.0)
MCV: 85 fL (ref 80.0–100.0)
MONOS PCT: 8 %
Monocytes Absolute: 0.6 10*3/uL (ref 0.2–0.9)
NEUTROS ABS: 5 10*3/uL (ref 1.4–6.5)
NEUTROS PCT: 65 %
Platelets: 287 10*3/uL (ref 150–440)
RBC: 4.62 MIL/uL (ref 3.80–5.20)
RDW: 13 % (ref 11.5–14.5)
WBC: 7.5 10*3/uL (ref 3.6–11.0)

## 2017-08-13 LAB — BASIC METABOLIC PANEL
ANION GAP: 4 — AB (ref 5–15)
BUN: 16 mg/dL (ref 6–20)
CHLORIDE: 106 mmol/L (ref 101–111)
CO2: 26 mmol/L (ref 22–32)
Calcium: 9.2 mg/dL (ref 8.9–10.3)
Creatinine, Ser: 0.84 mg/dL (ref 0.44–1.00)
GFR calc non Af Amer: >60 mL/min (ref >60)
Glucose, Bld: 99 mg/dL (ref 65–99)
POTASSIUM: 3.8 mmol/L (ref 3.5–5.1)
SODIUM: 136 mmol/L (ref 135–145)

## 2017-08-13 LAB — APTT: aPTT: 31 seconds (ref 24–36)

## 2017-08-13 LAB — PROTIME-INR
INR: 0.93
PROTHROMBIN TIME: 12.4 s (ref 11.4–15.2)

## 2017-08-14 MED ORDER — CEFAZOLIN SODIUM-DEXTROSE 2-4 GM/100ML-% IV SOLN
2.0000 g | INTRAVENOUS | Status: AC
  Administered 2017-08-15: 2 g via INTRAVENOUS

## 2017-08-15 ENCOUNTER — Encounter: Payer: Self-pay | Admitting: *Deleted

## 2017-08-15 ENCOUNTER — Ambulatory Visit
Admission: RE | Admit: 2017-08-15 | Discharge: 2017-08-15 | Disposition: A | Discharge status: Home / Self Care | Payer: BLUE CROSS/BLUE SHIELD | Source: Ambulatory Visit | Attending: Orthopedic Surgery | Admitting: Orthopedic Surgery

## 2017-08-15 ENCOUNTER — Ambulatory Visit: Payer: BLUE CROSS/BLUE SHIELD | Admitting: Certified Registered Nurse Anesthetist

## 2017-08-15 ENCOUNTER — Encounter
Admission: RE | Disposition: A | Discharge status: Home / Self Care | Payer: Self-pay | Source: Ambulatory Visit | Attending: Orthopedic Surgery

## 2017-08-15 DIAGNOSIS — M7542 Impingement syndrome of left shoulder: Secondary | ICD-10-CM | POA: Diagnosis not present

## 2017-08-15 DIAGNOSIS — M7552 Bursitis of left shoulder: Secondary | ICD-10-CM | POA: Insufficient documentation

## 2017-08-15 DIAGNOSIS — M75112 Incomplete rotator cuff tear or rupture of left shoulder, not specified as traumatic: Secondary | ICD-10-CM | POA: Insufficient documentation

## 2017-08-15 DIAGNOSIS — Z87891 Personal history of nicotine dependence: Secondary | ICD-10-CM | POA: Insufficient documentation

## 2017-08-15 DIAGNOSIS — Z79899 Other long term (current) drug therapy: Secondary | ICD-10-CM | POA: Insufficient documentation

## 2017-08-15 DIAGNOSIS — M1990 Unspecified osteoarthritis, unspecified site: Secondary | ICD-10-CM | POA: Insufficient documentation

## 2017-08-15 DIAGNOSIS — F419 Anxiety disorder, unspecified: Secondary | ICD-10-CM | POA: Insufficient documentation

## 2017-08-15 DIAGNOSIS — Z85820 Personal history of malignant melanoma of skin: Secondary | ICD-10-CM | POA: Insufficient documentation

## 2017-08-15 HISTORY — DX: Unspecified osteoarthritis, unspecified site: M19.90

## 2017-08-15 HISTORY — DX: Personal history of urinary calculi: Z87.442

## 2017-08-15 HISTORY — PX: SHOULDER ARTHROSCOPY WITH OPEN ROTATOR CUFF REPAIR AND DISTAL CLAVICLE ACROMINECTOMY: SHX5683

## 2017-08-15 SURGERY — SHOULDER ARTHROSCOPY WITH OPEN ROTATOR CUFF REPAIR AND DISTAL CLAVICLE ACROMINECTOMY
Anesthesia: General | Site: Shoulder | Laterality: Left | Wound class: Clean

## 2017-08-15 MED ORDER — SUCCINYLCHOLINE CHLORIDE 20 MG/ML IJ SOLN
INTRAMUSCULAR | Status: DC | PRN
  Administered 2017-08-15: 4 mg via INTRAVENOUS

## 2017-08-15 MED ORDER — BUPIVACAINE LIPOSOME 1.3 % IJ SUSP
INTRAMUSCULAR | Status: AC
  Filled 2017-08-15: qty 20

## 2017-08-15 MED ORDER — BUPIVACAINE HCL (PF) 0.5 % IJ SOLN
INTRAMUSCULAR | Status: DC | PRN
  Administered 2017-08-15: 10 mL via PERINEURAL

## 2017-08-15 MED ORDER — SEVOFLURANE IN SOLN
RESPIRATORY_TRACT | Status: AC
  Filled 2017-08-15: qty 250

## 2017-08-15 MED ORDER — EPHEDRINE SULFATE 50 MG/ML IJ SOLN
INTRAMUSCULAR | Status: DC | PRN
  Administered 2017-08-15: 5 mg via INTRAVENOUS

## 2017-08-15 MED ORDER — FAMOTIDINE 20 MG PO TABS
ORAL_TABLET | ORAL | Status: AC
  Administered 2017-08-15: 20 mg via ORAL
  Filled 2017-08-15: qty 1

## 2017-08-15 MED ORDER — PHENYLEPHRINE HCL 10 MG/ML IJ SOLN
INTRAMUSCULAR | Status: DC | PRN
  Administered 2017-08-15: 200 ug via INTRAVENOUS
  Administered 2017-08-15 (×3): 100 ug via INTRAVENOUS

## 2017-08-15 MED ORDER — CHLORHEXIDINE GLUCONATE CLOTH 2 % EX PADS: 6.0000 | MEDICATED_PAD | Freq: Once | CUTANEOUS | Status: DC

## 2017-08-15 MED ORDER — LACTATED RINGERS IV SOLN
INTRAVENOUS | Status: DC
  Administered 2017-08-15: 07:00:00 via INTRAVENOUS

## 2017-08-15 MED ORDER — ACETAMINOPHEN 10 MG/ML IV SOLN
INTRAVENOUS | Status: AC
  Filled 2017-08-15: qty 100

## 2017-08-15 MED ORDER — FENTANYL CITRATE (PF) 100 MCG/2ML IJ SOLN: 25.0000 ug | INTRAMUSCULAR | Status: DC | PRN

## 2017-08-15 MED ORDER — EPINEPHRINE 30 MG/30ML IJ SOLN
INTRAMUSCULAR | Status: AC
  Filled 2017-08-15: qty 1

## 2017-08-15 MED ORDER — BUPIVACAINE HCL (PF) 0.25 % IJ SOLN
INTRAMUSCULAR | Status: DC | PRN
  Administered 2017-08-15: 30 mL

## 2017-08-15 MED ORDER — LIDOCAINE HCL (PF) 1 % IJ SOLN
INTRAMUSCULAR | Status: AC
  Filled 2017-08-15: qty 30

## 2017-08-15 MED ORDER — SUGAMMADEX SODIUM 500 MG/5ML IV SOLN
INTRAVENOUS | Status: DC | PRN
  Administered 2017-08-15: 150 mg via INTRAVENOUS

## 2017-08-15 MED ORDER — OXYCODONE HCL 5 MG PO TABS: 5.0000 mg | ORAL_TABLET | ORAL | 0 refills | Status: DC | PRN

## 2017-08-15 MED ORDER — FAMOTIDINE 20 MG PO TABS
20.0000 mg | ORAL_TABLET | Freq: Once | ORAL | Status: AC
  Administered 2017-08-15: 20 mg via ORAL

## 2017-08-15 MED ORDER — SODIUM CHLORIDE 0.9 % IV SOLN
INTRAVENOUS | Status: DC | PRN
  Administered 2017-08-15: 50 ug/min via INTRAVENOUS

## 2017-08-15 MED ORDER — FENTANYL CITRATE (PF) 100 MCG/2ML IJ SOLN
INTRAMUSCULAR | Status: AC
  Filled 2017-08-15: qty 2

## 2017-08-15 MED ORDER — PROPOFOL 10 MG/ML IV BOLUS
INTRAVENOUS | Status: AC
  Filled 2017-08-15: qty 20

## 2017-08-15 MED ORDER — EPINEPHRINE PF 1 MG/ML IJ SOLN
INTRAMUSCULAR | Status: DC | PRN
  Administered 2017-08-15: 13 mL

## 2017-08-15 MED ORDER — ACETAMINOPHEN 10 MG/ML IV SOLN
INTRAVENOUS | Status: DC | PRN
  Administered 2017-08-15: 1000 mg via INTRAVENOUS

## 2017-08-15 MED ORDER — BUPIVACAINE HCL (PF) 0.5 % IJ SOLN
INTRAMUSCULAR | Status: AC
  Filled 2017-08-15: qty 10

## 2017-08-15 MED ORDER — EPHEDRINE SULFATE 50 MG/ML IJ SOLN
INTRAMUSCULAR | Status: AC
  Filled 2017-08-15: qty 1

## 2017-08-15 MED ORDER — FENTANYL CITRATE (PF) 100 MCG/2ML IJ SOLN
50.0000 ug | Freq: Once | INTRAMUSCULAR | Status: AC
  Administered 2017-08-15: 50 ug via INTRAVENOUS

## 2017-08-15 MED ORDER — DEXAMETHASONE SODIUM PHOSPHATE 10 MG/ML IJ SOLN
INTRAMUSCULAR | Status: AC
  Filled 2017-08-15: qty 1

## 2017-08-15 MED ORDER — NEOMYCIN-POLYMYXIN B GU 40-200000 IR SOLN
Status: AC
  Filled 2017-08-15: qty 2

## 2017-08-15 MED ORDER — SUCCINYLCHOLINE CHLORIDE 20 MG/ML IJ SOLN
INTRAMUSCULAR | Status: AC
  Filled 2017-08-15: qty 1

## 2017-08-15 MED ORDER — FENTANYL CITRATE (PF) 100 MCG/2ML IJ SOLN
INTRAMUSCULAR | Status: DC | PRN
  Administered 2017-08-15: 50 ug via INTRAVENOUS
  Administered 2017-08-15 (×2): 25 ug via INTRAVENOUS

## 2017-08-15 MED ORDER — PROPOFOL 10 MG/ML IV BOLUS
INTRAVENOUS | Status: DC | PRN
  Administered 2017-08-15: 150 mg via INTRAVENOUS

## 2017-08-15 MED ORDER — LIDOCAINE HCL (PF) 1 % IJ SOLN
INTRAMUSCULAR | Status: DC | PRN
  Administered 2017-08-15: 5 mL via SUBCUTANEOUS

## 2017-08-15 MED ORDER — ROCURONIUM BROMIDE 100 MG/10ML IV SOLN
INTRAVENOUS | Status: DC | PRN
  Administered 2017-08-15: 50 mg via INTRAVENOUS

## 2017-08-15 MED ORDER — NEOMYCIN-POLYMYXIN B GU 40-200000 IR SOLN
Status: DC | PRN
  Administered 2017-08-15: 2 mL

## 2017-08-15 MED ORDER — MIDAZOLAM HCL 2 MG/2ML IJ SOLN
1.0000 mg | Freq: Once | INTRAMUSCULAR | Status: AC
  Administered 2017-08-15: 1 mg via INTRAVENOUS

## 2017-08-15 MED ORDER — CEFAZOLIN SODIUM-DEXTROSE 2-4 GM/100ML-% IV SOLN
INTRAVENOUS | Status: AC
  Filled 2017-08-15: qty 100

## 2017-08-15 MED ORDER — ROCURONIUM BROMIDE 50 MG/5ML IV SOLN
INTRAVENOUS | Status: AC
  Filled 2017-08-15: qty 1

## 2017-08-15 MED ORDER — BUPIVACAINE HCL (PF) 0.25 % IJ SOLN
INTRAMUSCULAR | Status: AC
  Filled 2017-08-15: qty 30

## 2017-08-15 MED ORDER — PHENYLEPHRINE HCL 10 MG/ML IJ SOLN
INTRAMUSCULAR | Status: AC
  Filled 2017-08-15: qty 1

## 2017-08-15 MED ORDER — PROMETHAZINE HCL 25 MG/ML IJ SOLN: 6.2500 mg | INTRAMUSCULAR | Status: DC | PRN

## 2017-08-15 MED ORDER — LIDOCAINE HCL (CARDIAC) 20 MG/ML IV SOLN
INTRAVENOUS | Status: DC | PRN
  Administered 2017-08-15: 100 mg via INTRAVENOUS

## 2017-08-15 MED ORDER — ONDANSETRON HCL 4 MG/2ML IJ SOLN
INTRAMUSCULAR | Status: AC
  Filled 2017-08-15: qty 2

## 2017-08-15 MED ORDER — FENTANYL CITRATE (PF) 100 MCG/2ML IJ SOLN
INTRAMUSCULAR | Status: AC
  Administered 2017-08-15: 50 ug via INTRAVENOUS
  Filled 2017-08-15: qty 2

## 2017-08-15 MED ORDER — SUGAMMADEX SODIUM 200 MG/2ML IV SOLN
INTRAVENOUS | Status: AC
  Filled 2017-08-15: qty 2

## 2017-08-15 MED ORDER — BUPIVACAINE LIPOSOME 1.3 % IJ SUSP
INTRAMUSCULAR | Status: DC | PRN
  Administered 2017-08-15: 20 mL via PERINEURAL

## 2017-08-15 MED ORDER — DEXAMETHASONE SODIUM PHOSPHATE 10 MG/ML IJ SOLN
INTRAMUSCULAR | Status: DC | PRN
  Administered 2017-08-15: 10 mg via INTRAVENOUS

## 2017-08-15 MED ORDER — LIDOCAINE HCL (PF) 2 % IJ SOLN
INTRAMUSCULAR | Status: AC
  Filled 2017-08-15: qty 10

## 2017-08-15 MED ORDER — LIDOCAINE HCL (PF) 1 % IJ SOLN
INTRAMUSCULAR | Status: AC
  Filled 2017-08-15: qty 5

## 2017-08-15 MED ORDER — ONDANSETRON HCL 4 MG PO TABS: 4.0000 mg | ORAL_TABLET | Freq: Three times a day (TID) | ORAL | 0 refills | Status: DC | PRN

## 2017-08-15 MED ORDER — MIDAZOLAM HCL 2 MG/2ML IJ SOLN
INTRAMUSCULAR | Status: AC
  Administered 2017-08-15: 1 mg via INTRAVENOUS
  Filled 2017-08-15: qty 2

## 2017-08-15 SURGICAL SUPPLY — 72 items
ADAPTER IRRIG TUBE 2 SPIKE SOL (ADAPTER) ×6 IMPLANT
ANCHOR SUT 5.5 MULTIFIX (Orthopedic Implant) ×2 IMPLANT
ANCHOR SUT 5.5MM MULTIFIX (Orthopedic Implant) ×1 IMPLANT
BUR RADIUS 4.0X18.5 (BURR) ×3 IMPLANT
BUR RADIUS 5.5 (BURR) ×3 IMPLANT
CANISTER SUCT 3000ML (MISCELLANEOUS) ×3 IMPLANT
CANISTER SUCT LVC 12 LTR MEDI- (MISCELLANEOUS) IMPLANT
CANNULA 5.75X7 CRYSTAL CLEAR (CANNULA) ×6 IMPLANT
CANNULA PARTIAL THREAD 2X7 (CANNULA) ×3 IMPLANT
CANNULA TWIST IN 8.25X9CM (CANNULA) ×6 IMPLANT
CLOSURE WOUND 1/2 X4 (GAUZE/BANDAGES/DRESSINGS) ×1
CONNECTOR PERFECT PASSER (CONNECTOR) ×3 IMPLANT
COOLER POLAR GLACIER W/PUMP (MISCELLANEOUS) ×3 IMPLANT
CRADLE LAMINECT ARM (MISCELLANEOUS) ×6 IMPLANT
DEVICE SUCT BLK HOLE OR FLOOR (MISCELLANEOUS) ×6 IMPLANT
DRAPE IMP U-DRAPE 54X76 (DRAPES) ×6 IMPLANT
DRAPE INCISE IOBAN 66X45 STRL (DRAPES) ×3 IMPLANT
DRAPE SHEET LG 3/4 BI-LAMINATE (DRAPES) ×3 IMPLANT
DRAPE U-SHAPE 47X51 STRL (DRAPES) ×3 IMPLANT
DURAPREP 26ML APPLICATOR (WOUND CARE) ×12 IMPLANT
ELECT REM PT RETURN 9FT ADLT (ELECTROSURGICAL) ×3
ELECTRODE REM PT RTRN 9FT ADLT (ELECTROSURGICAL) ×1 IMPLANT
GAUZE PETRO XEROFOAM 1X8 (MISCELLANEOUS) ×3 IMPLANT
GAUZE SPONGE 4X4 12PLY STRL (GAUZE/BANDAGES/DRESSINGS) ×3 IMPLANT
GLOVE BIOGEL PI IND STRL 7.0 (GLOVE) ×6 IMPLANT
GLOVE BIOGEL PI IND STRL 9 (GLOVE) ×1 IMPLANT
GLOVE BIOGEL PI INDICATOR 7.0 (GLOVE) ×12
GLOVE BIOGEL PI INDICATOR 9 (GLOVE) ×2
GLOVE SURG 9.0 ORTHO LTXF (GLOVE) ×9 IMPLANT
GOWN STRL REUS TWL 2XL XL LVL4 (GOWN DISPOSABLE) ×3 IMPLANT
GOWN STRL REUS W/ TWL LRG LVL3 (GOWN DISPOSABLE) ×4 IMPLANT
GOWN STRL REUS W/TWL LRG LVL3 (GOWN DISPOSABLE) ×8
IV LACTATED RINGER IRRG 3000ML (IV SOLUTION) ×26
IV LR IRRIG 3000ML ARTHROMATIC (IV SOLUTION) ×13 IMPLANT
KIT RM TURNOVER STRD PROC AR (KITS) ×3 IMPLANT
KIT STABILIZATION SHOULDER (MISCELLANEOUS) ×3 IMPLANT
KIT SUTURE 2.8 Q-FIX DISP (MISCELLANEOUS) ×3 IMPLANT
KIT SUTURETAK 3.0 INSERT PERC (KITS) IMPLANT
MANIFOLD NEPTUNE II (INSTRUMENTS) ×3 IMPLANT
MASK FACE SPIDER DISP (MASK) ×3 IMPLANT
MAT BLUE FLOOR 46X72 FLO (MISCELLANEOUS) ×6 IMPLANT
NDL SAFETY ECLIPSE 18X1.5 (NEEDLE) ×1 IMPLANT
NEEDLE HYPO 18GX1.5 SHARP (NEEDLE) ×2
NEEDLE HYPO 22GX1.5 SAFETY (NEEDLE) ×3 IMPLANT
NS IRRIG 500ML POUR BTL (IV SOLUTION) ×3 IMPLANT
PACK ARTHROSCOPY SHOULDER (MISCELLANEOUS) ×3 IMPLANT
PAD WRAPON POLAR SHDR XLG (MISCELLANEOUS) ×1 IMPLANT
PASSER SUT CAPTURE FIRST (SUTURE) ×3 IMPLANT
SET TUBE SUCT SHAVER OUTFL 24K (TUBING) ×3 IMPLANT
SET TUBE TIP INTRA-ARTICULAR (MISCELLANEOUS) ×3 IMPLANT
SLEEVE PROTECTION STRL DISP (MISCELLANEOUS) ×3 IMPLANT
STRIP CLOSURE SKIN 1/2X4 (GAUZE/BANDAGES/DRESSINGS) ×2 IMPLANT
SUT ETHILON 4-0 (SUTURE) ×2
SUT ETHILON 4-0 FS2 18XMFL BLK (SUTURE) ×1
SUT LASSO 90 DEG SD STR (SUTURE) IMPLANT
SUT MNCRL 4-0 (SUTURE) ×2
SUT MNCRL 4-0 27XMFL (SUTURE) ×1
SUT PDS AB 0 CT1 27 (SUTURE) ×3 IMPLANT
SUT PERFECTPASSER WHITE CART (SUTURE) ×3 IMPLANT
SUT SMART STITCH CARTRIDGE (SUTURE) ×3 IMPLANT
SUT VIC AB 0 CT1 36 (SUTURE) ×3 IMPLANT
SUT VIC AB 2-0 CT2 27 (SUTURE) ×3 IMPLANT
SUTURE ETHLN 4-0 FS2 18XMF BLK (SUTURE) ×1 IMPLANT
SUTURE MAGNUM WIRE 2X48 BLK (SUTURE) ×6 IMPLANT
SUTURE MNCRL 4-0 27XMF (SUTURE) ×1 IMPLANT
SYR 10ML LL (SYRINGE) ×3 IMPLANT
TAPE MICROFOAM 4IN (TAPE) ×3 IMPLANT
TUBING ARTHRO INFLOW-ONLY STRL (TUBING) ×3 IMPLANT
TUBING CONNECTING 10 (TUBING) ×2 IMPLANT
TUBING CONNECTING 10' (TUBING) ×1
WAND HAND CNTRL MULTIVAC 90 (MISCELLANEOUS) ×3 IMPLANT
WRAPON POLAR PAD SHDR XLG (MISCELLANEOUS) ×3

## 2017-08-15 NOTE — OR Nursing (Signed)
Patient has sling and polar care in place

## 2017-08-15 NOTE — Discharge Instructions (Signed)

## 2017-08-15 NOTE — Transfer of Care (Signed)
Immediate Anesthesia Transfer of Care Note  Patient: Veronica Montes  Procedure(s) Performed: SHOULDER ARTHROSCOPY WITH ROTATOR CUFF REPAIR AND SUBACROMINAL DECOMPRESSION (Left Shoulder)  Patient Location: PACU  Anesthesia Type:General  Level of Consciousness: sedated  Airway & Oxygen Therapy: Patient Spontanous Breathing and Patient connected to face mask oxygen  Post-op Assessment: Report given to RN and Post -op Vital signs reviewed and stable  Post vital signs: Reviewed and stable  Last Vitals:  Vitals:   08/15/17 0742 08/15/17 1028  BP: 124/86 119/86  Pulse: 75 89  Resp: 17 15  Temp:  (!) 36.3 C  SpO2: 100% 99%    Last Pain:  Vitals:   08/15/17 1028  TempSrc: Temporal  PainSc:       Patients Stated Pain Goal: 0 (21/97/58 [id number])  Complications: No apparent anesthesia complications

## 2017-08-15 NOTE — Anesthesia Preprocedure Evaluation (Signed)
Anesthesia Evaluation  Patient identified by MRN, date of birth, ID band Patient awake    Reviewed: Allergy & Precautions, H&P , NPO status , Patient's Chart, lab work & pertinent test results, reviewed documented beta blocker date and time   Airway Mallampati: II  TM Distance: >3 FB Neck ROM: full    Dental  (+) Caps, Dental Advidsory Given   Pulmonary neg shortness of breath, neg COPD, Recent URI  (on antibiotics for sinus infection),           Cardiovascular Exercise Tolerance: Good negative cardio ROS       Neuro/Psych negative neurological ROS  negative psych ROS   GI/Hepatic negative GI ROS, Neg liver ROS,   Endo/Other  negative endocrine ROS  Renal/GU negative Renal ROS  negative genitourinary   Musculoskeletal   Abdominal   Peds  Hematology negative hematology ROS (+)   Anesthesia Other Findings Past Medical History: No date: Anxiety No date: Arthritis 1991: History of kidney stones 2011: Melanoma ([place])     Comment:  Resected from Right upper chest area.   Reproductive/Obstetrics negative OB ROS                             Anesthesia Physical Anesthesia Plan  ASA: II  Anesthesia Plan: General   Post-op Pain Management:  Regional for Post-op pain   Induction: Intravenous  PONV Risk Score and Plan: 3 and Ondansetron and Dexamethasone  Airway Management Planned: Oral ETT  Additional Equipment:   Intra-op Plan:   Post-operative Plan: Extubation in OR  Informed Consent: I have reviewed the patients History and Physical, chart, labs and discussed the procedure including the risks, benefits and alternatives for the proposed anesthesia with the patient or authorized representative who has indicated his/her understanding and acceptance.   Dental Advisory Given  Plan Discussed with: Anesthesiologist, CRNA and Surgeon  Anesthesia Plan Comments:         Anesthesia  Quick Evaluation

## 2017-08-15 NOTE — Progress Notes (Signed)
Capillary refill positive to left hand  Fingers warm and dry

## 2017-08-15 NOTE — Anesthesia Post-op Follow-up Note (Signed)
Anesthesia QCDR form completed.        

## 2017-08-15 NOTE — Anesthesia Postprocedure Evaluation (Signed)
Anesthesia Post Note  Patient: Dorisann Schwanke  Procedure(s) Performed: SHOULDER ARTHROSCOPY WITH ROTATOR CUFF REPAIR AND SUBACROMINAL DECOMPRESSION (Left Shoulder)  Patient location during evaluation: PACU Anesthesia Type: General Level of consciousness: awake and alert Pain management: pain level controlled Vital Signs Assessment: post-procedure vital signs reviewed and stable Respiratory status: spontaneous breathing, nonlabored ventilation, respiratory function stable and patient connected to nasal cannula oxygen Cardiovascular status: blood pressure returned to baseline and stable Postop Assessment: no apparent nausea or vomiting Anesthetic complications: no     Last Vitals:  Vitals:   08/15/17 1116 08/15/17 1149  BP: 113/81 115/84  Pulse: 68 88  Resp: 16   Temp: (!) 36.4 C   SpO2: 97% 99%    Last Pain:  Vitals:   08/15/17 1116  TempSrc: Temporal  PainSc:                  Martha Clan

## 2017-08-15 NOTE — H&P (Signed)
The patient has been re-examined, and the chart reviewed, and there have been no interval changes to the documented history and physical.    The risks, benefits, and alternatives have been discussed at length, and the patient is willing to proceed.   

## 2017-08-15 NOTE — Op Note (Addendum)
08/15/2017  10:26 AM  PATIENT:  Veronica Montes  51 y.o. female  PRE-OPERATIVE DIAGNOSIS:  Left shoulder impingement with possible rotator cuff tear  POST-OPERATIVE DIAGNOSIS:  Left shoulder high grade partial thickness tear of the supraspinatus and shoulder impingement with bursitis  PROCEDURE:  Procedure(s) with comments: LEFT SHOULDER ARTHROSCOPIC ROTATOR CUFF REPAIR AND SUBACROMINAL DECOMPRESSION   SURGEON:  Surgeon(s) and Role:    Thornton Park, MD - Primary  ANESTHESIA:   local, general and paracervical block   PREOPERATIVE INDICATIONS:  Veronica Montes is a  51 y.o. female with a diagnosis of M75.42 Impingement syndrome of left shoulder with possible rotator cuff tear by MRI had failed conservative treatment and elected for surgical management.    The risks benefits and alternatives were discussed with the patient preoperatively including but not limited to the risks of infection, bleeding, nerve injury, persistent pain or weakness, failure of the hardware, re-tear of the rotator cuff and the need for further surgery. Medical risks include DVT and pulmonary embolism, myocardial infarction, stroke, pneumonia, respiratory failure and death. Patient understood these risks and wished to proceed.  OPERATIVE IMPLANTS: Webster Multifix anchor x 1  OPERATIVE FINDINGS: A grade partial thickness tear involving the supraspinatus, extensive subacromial and subdeltoid bursitis with subacromial spurs  OPERATIVE PROCEDURE: The patient was met in the preoperative area. The left shoulder was signed with the word yes and my initials according the hospital's correct site of surgery protocol.   History and physical was updated.   Patient underwent an interscalene block with Exparel by the anesthesia service.  Patient was brought to the operating room where she underwent general endotracheal intubation.  The patient was placed in a beachchair position.  A spider arm positioner was used for  this case. Examination under anesthesia revealed no limitation of motion or instability with load shift testing. The patient had a negative sulcus sign.  Patient received IV prior to the onset of the case.  Patient was prepped and draped in a sterile fashion. A timeout was performed to verify the patient's name, date of birth, medical record number, correct site of surgery and correct procedure to be performed there was also used to verify the patient received antibiotics that all appropriate instruments, implants and radiographs studies were available in the room. Once all in attendance were in agreement case began.  Bony landmarks were drawn out with a surgical marker along with proposed arthroscopy incisions. These were pre-injected with 1% lidocaine plain. An 11 blade was used to establish a posterior portal through which the arthroscope was placed in the glenohumeral joint. A full diagnostic examination of the shoulder was performed.  Patient was found to have fraying of the undersurface of the supraspinatus just posterior to the biceps tendon.  This was debrided with a 4.0 mm resector shaver blade until intact cuff was observed. An 18-gauge spinal needle was used to place a  0 PDS suture through the rotator cuff tear for identification from the bursal side.  The arthroscope was then placed in the subacromial space.   Extensive bursitis was encountered and debrided using a 4-0 resector shaver blade and a 90 ArthroCare wand from a lateral portal which was established under direct visualization using an 18-gauge spinal needle.  The 0 PDS suture was identified.  The surrounding rotator cuff demonstrated a high-grade partial-thickness tear involving the supraspinatus corresponding to the area of fraying of the rotator cuff seen from the glenohumeral side.   Rotator cuff tear was debrided  using a 40 resector shaver blade. The greater tuberosity footprint was then burred to remove torn fibers of the rotator  cuff and provide a clean site for repair of the rotator cuff. The rotator cuff was evaluated by arthroscope and through the lateral portal following debridement.  The scope was then placed back in the posterior portal and a subacromial decompression was performed using a 5.5 mm resector shaver blade from the lateral portal. A single Perfect Pass suture was placed in the lateral border of the rotator cuff tear. A multi-fix anchor was used for repair of the rotator cuff.  A starting punch was used to create the initial hole. The suture ends from the perfect Pass were threaded through the multi-fix anchor. This anchor was then [facility] into place. The sutures were tensioned to allow for anatomic reduction of the rotator cuff. The anchor was then screwed into position and the inserting handle was removed. Arthroscopic images of the repair were taken along with arthroscopic images of the subacromial decompression.  2 arthroscope was placed back in the glenohumeral joint and arthroscopic images of the repair from the humeral joint were also taken..  Skin closure for the arthroscopic incisions was performed with 4-0 nylon.  0.25% Marcaine plain was then injected into the subacromial space for postoperative pain control. A dry sterile dressing was applied.  The patient was placed in an abduction sling and a Polar Care was applied to the shoulder.  All sharp and it instrument counts were correct at the conclusion of the case. I was scrubbed and present for the entire case. I spoke with the patient's family postoperatively to let them know the case had been performed without complication and the patient was stable in recovery room.

## 2017-08-15 NOTE — Anesthesia Procedure Notes (Signed)
Anesthesia Regional Block: Interscalene brachial plexus block   Pre-Anesthetic Checklist: ,, timeout performed, Correct Patient, Correct Site, Correct Laterality, Correct Procedure, Correct Position, site marked, Risks and benefits discussed,  Surgical consent,  Pre-op evaluation,  At surgeon's request and post-op pain management  Laterality: Left and Upper  Prep: alcohol swabs       Needles:  Injection technique: Single-shot  Needle Type: Stimiplex     Needle Length: 5cm  Needle Gauge: 22     Additional Needles:   Procedures:,,,, ultrasound used (permanent image in chart),,,,   Nerve Stimulator or Paresthesia:  Response: biceps flexion,   Additional Responses:   Narrative:  Start time: 08/15/2017 7:35 AM End time: 08/15/2017 7:39 AM Injection made incrementally with aspirations every 5 mL.  Performed by: Personally  Anesthesiologist: Martha Clan, MD  Additional Notes: Functioning IV was confirmed and monitors were applied.  A 61mm 22ga Stimuplex needle was used. Sterile prep and drape,hand hygiene and sterile gloves were used.  Negative aspiration and negative test dose prior to incremental administration of local anesthetic. The patient tolerated the procedure well.

## 2017-08-15 NOTE — Anesthesia Procedure Notes (Signed)
Procedure Name: Intubation Date/Time: 08/15/2017 7:55 AM Performed by: Johnna Acosta, CRNA Pre-anesthesia Checklist: Patient identified, Emergency Drugs available, Suction available, Patient being monitored and Timeout performed Patient Re-evaluated:Patient Re-evaluated prior to induction Oxygen Delivery Method: Circle system utilized Preoxygenation: Pre-oxygenation with 100% oxygen Induction Type: IV induction Ventilation: Mask ventilation without difficulty and Oral airway inserted - appropriate to patient size Laryngoscope Size: Sabra Heck and 2 Grade View: Grade I Tube type: Oral Tube size: 7.0 mm Number of attempts: 1 Airway Equipment and Method: Stylet Placement Confirmation: ETT inserted through vocal cords under direct vision,  positive ETCO2 and breath sounds checked- equal and bilateral Secured at: 21 cm Tube secured with: Tape Dental Injury: Teeth and Oropharynx as per pre-operative assessment  Difficulty Due To: Difficulty was unanticipated

## 2017-08-15 NOTE — Progress Notes (Signed)
No nausea or vomiting  States ready to go home

## 2017-12-22 ENCOUNTER — Telehealth: Payer: Self-pay | Admitting: *Deleted

## 2017-12-22 DIAGNOSIS — R1012 Left upper quadrant pain: Secondary | ICD-10-CM

## 2017-12-23 NOTE — Telephone Encounter (Signed)
Pt needs referral for [place] abd Janeece Riggers

## 2017-12-30 ENCOUNTER — Other Ambulatory Visit: Payer: Self-pay | Admitting: Obstetrics and Gynecology

## 2017-12-30 DIAGNOSIS — R102 Pelvic and perineal pain: Secondary | ICD-10-CM

## 2018-01-06 ENCOUNTER — Other Ambulatory Visit: Payer: BLUE CROSS/BLUE SHIELD

## 2018-01-13 ENCOUNTER — Encounter: Payer: BLUE CROSS/BLUE SHIELD | Admitting: Obstetrics and Gynecology

## 2018-01-14 ENCOUNTER — Encounter: Payer: BLUE CROSS/BLUE SHIELD | Admitting: Obstetrics and Gynecology

## 2018-01-19 ENCOUNTER — Other Ambulatory Visit: Payer: BLUE CROSS/BLUE SHIELD

## 2018-02-18 ENCOUNTER — Encounter: Payer: BLUE CROSS/BLUE SHIELD | Admitting: Obstetrics and Gynecology

## 2018-03-06 ENCOUNTER — Encounter: Payer: BLUE CROSS/BLUE SHIELD | Admitting: Obstetrics and Gynecology

## 2018-03-10 ENCOUNTER — Ambulatory Visit (INDEPENDENT_AMBULATORY_CARE_PROVIDER_SITE_OTHER): Payer: BLUE CROSS/BLUE SHIELD | Admitting: Obstetrics and Gynecology

## 2018-03-10 VITALS — BP 119/77 | HR 96 | Ht 63.0 in | Wt 136.3 lb

## 2018-03-10 DIAGNOSIS — Z01419 Encounter for gynecological examination (general) (routine) without abnormal findings: Secondary | ICD-10-CM

## 2018-03-10 DIAGNOSIS — Z90711 Acquired absence of uterus with remaining cervical stump: Secondary | ICD-10-CM | POA: Insufficient documentation

## 2018-03-10 DIAGNOSIS — Z1231 Encounter for screening mammogram for malignant neoplasm of breast: Secondary | ICD-10-CM

## 2018-03-10 DIAGNOSIS — Z1239 Encounter for other screening for malignant neoplasm of breast: Secondary | ICD-10-CM

## 2018-03-10 NOTE — Progress Notes (Signed)
Pt is here for an annual exam. LPS 01/08/17 neg HPV neg.

## 2018-03-10 NOTE — Patient Instructions (Addendum)
1.  No Pap smear done.  No further Pap smears are needed 2.  Mammogram is ordered 3.  Screening labs are to be obtained through primary care-Dr. Doy Hutching 4.  Stool guaiac cards are deferred due to recent colonoscopy 5.  Continue with healthy eating and exercise 6.  Return in 1 year for annual exam   Health Maintenance for Postmenopausal Women Menopause is a normal process in which your reproductive ability comes to an end. This process happens gradually over a span of months to years, usually between the ages of 83 and 52. Menopause is complete when you have missed 12 consecutive menstrual periods. It is important to talk with your health care provider about some of the most common conditions that affect postmenopausal women, such as heart disease, cancer, and bone loss (osteoporosis). Adopting a healthy lifestyle and getting preventive care can help to promote your health and wellness. Those actions can also lower your chances of developing some of these common conditions. What should I know about menopause? During menopause, you may experience a number of symptoms, such as:  Moderate-to-severe hot flashes.  Night sweats.  Decrease in sex drive.  Mood swings.  Headaches.  Tiredness.  Irritability.  Memory problems.  Insomnia.  Choosing to treat or not to treat menopausal changes is an individual decision that you make with your health care provider. What should I know about hormone replacement therapy and supplements? Hormone therapy products are effective for treating symptoms that are associated with menopause, such as hot flashes and night sweats. Hormone replacement carries certain risks, especially as you become older. If you are thinking about using estrogen or estrogen with progestin treatments, discuss the benefits and risks with your health care provider. What should I know about heart disease and stroke? Heart disease, heart attack, and stroke become more likely as you  age. This may be due, in part, to the hormonal changes that your body experiences during menopause. These can affect how your body processes dietary fats, triglycerides, and cholesterol. Heart attack and stroke are both medical emergencies. There are many things that you can do to help prevent heart disease and stroke:  Have your blood pressure checked at least every 1-2 years. High blood pressure causes heart disease and increases the risk of stroke.  If you are 52-37 years old, ask your health care provider if you should take aspirin to prevent a heart attack or a stroke.  Do not use any tobacco products, including cigarettes, chewing tobacco, or electronic cigarettes. If you need help quitting, ask your health care provider.  It is important to eat a healthy diet and maintain a healthy weight. ? Be sure to include plenty of vegetables, fruits, low-fat dairy products, and lean protein. ? Avoid eating foods that are high in solid fats, added sugars, or salt (sodium).  Get regular exercise. This is one of the most important things that you can do for your health. ? Try to exercise for at least 150 minutes each week. The type of exercise that you do should increase your heart rate and make you sweat. This is known as moderate-intensity exercise. ? Try to do strengthening exercises at least twice each week. Do these in addition to the moderate-intensity exercise.  Know your numbers.Ask your health care provider to check your cholesterol and your blood glucose. Continue to have your blood tested as directed by your health care provider.  What should I know about cancer screening? There are several types of cancer. Take  the following steps to reduce your risk and to catch any cancer development as early as possible. Breast Cancer  Practice breast self-awareness. ? This means understanding how your breasts normally appear and feel. ? It also means doing regular breast self-exams. Let your health  care provider know about any changes, no matter how small.  If you are 52 or older, have a clinician do a breast exam (clinical breast exam or CBE) every year. Depending on your age, family history, and medical history, it may be recommended that you also have a yearly breast X-ray (mammogram).  If you have a family history of breast cancer, talk with your health care provider about genetic screening.  If you are at high risk for breast cancer, talk with your health care provider about having an MRI and a mammogram every year.  Breast cancer (BRCA) gene test is recommended for women who have family members with BRCA-related cancers. Results of the assessment will determine the need for genetic counseling and BRCA1 and for BRCA2 testing. BRCA-related cancers include these types: ? Breast. This occurs in males or females. ? Ovarian. ? Tubal. This may also be called fallopian tube cancer. ? Cancer of the abdominal or pelvic lining (peritoneal cancer). ? Prostate. ? Pancreatic.  Cervical, Uterine, and Ovarian Cancer Your health care provider may recommend that you be screened regularly for cancer of the pelvic organs. These include your ovaries, uterus, and vagina. This screening involves a pelvic exam, which includes checking for microscopic changes to the surface of your cervix (Pap test).  For women ages 52-65, health care providers may recommend a pelvic exam and a Pap test every three years. For women ages 52-65, they may recommend the Pap test and pelvic exam, combined with testing for human papilloma virus (HPV), every five years. Some types of HPV increase your risk of cervical cancer. Testing for HPV may also be done on women of any age who have unclear Pap test results.  Other health care providers may not recommend any screening for nonpregnant women who are considered low risk for pelvic cancer and have no symptoms. Ask your health care provider if a screening pelvic exam is right for  you.  If you have had past treatment for cervical cancer or a condition that could lead to cancer, you need Pap tests and screening for cancer for at least 20 years after your treatment. If Pap tests have been discontinued for you, your risk factors (such as having a new sexual partner) need to be reassessed to determine if you should start having screenings again. Some women have medical problems that increase the chance of getting cervical cancer. In these cases, your health care provider may recommend that you have screening and Pap tests more often.  If you have a family history of uterine cancer or ovarian cancer, talk with your health care provider about genetic screening.  If you have vaginal bleeding after reaching menopause, tell your health care provider.  There are currently no reliable tests available to screen for ovarian cancer.  Lung Cancer Lung cancer screening is recommended for adults 67-4 years old who are at high risk for lung cancer because of a history of smoking. A yearly low-dose CT scan of the lungs is recommended if you:  Currently smoke.  Have a history of at least 30 pack-years of smoking and you currently smoke or have quit within the past 15 years. A pack-year is smoking an average of one pack of cigarettes per  day for one year.  Yearly screening should:  Continue until it has been 15 years since you quit.  Stop if you develop a health problem that would prevent you from having lung cancer treatment.  Colorectal Cancer  This type of cancer can be detected and can often be prevented.  Routine colorectal cancer screening usually begins at age 56 and continues through age 34.  If you have risk factors for colon cancer, your health care provider may recommend that you be screened at an earlier age.  If you have a family history of colorectal cancer, talk with your health care provider about genetic screening.  Your health care provider may also recommend  using home test kits to check for hidden blood in your stool.  A small camera at the end of a tube can be used to examine your colon directly (sigmoidoscopy or colonoscopy). This is done to check for the earliest forms of colorectal cancer.  Direct examination of the colon should be repeated every 5-10 years until age 66. However, if early forms of precancerous polyps or small growths are found or if you have a family history or genetic risk for colorectal cancer, you may need to be screened more often.  Skin Cancer  Check your skin from head to toe regularly.  Monitor any moles. Be sure to tell your health care provider: ? About any new moles or changes in moles, especially if there is a change in a mole's shape or color. ? If you have a mole that is larger than the size of a pencil eraser.  If any of your family members has a history of skin cancer, especially at a young age, talk with your health care provider about genetic screening.  Always use sunscreen. Apply sunscreen liberally and repeatedly throughout the day.  Whenever you are outside, protect yourself by wearing long sleeves, pants, a wide-brimmed hat, and sunglasses.  What should I know about osteoporosis? Osteoporosis is a condition in which bone destruction happens more quickly than new bone creation. After menopause, you may be at an increased risk for osteoporosis. To help prevent osteoporosis or the bone fractures that can happen because of osteoporosis, the following is recommended:  If you are 36-50 years old, get at least 1,000 mg of calcium and at least 600 mg of vitamin D per day.  If you are older than age 65 but younger than age 58, get at least 1,200 mg of calcium and at least 600 mg of vitamin D per day.  If you are older than age 45, get at least 1,200 mg of calcium and at least 800 mg of vitamin D per day.  Smoking and excessive alcohol intake increase the risk of osteoporosis. Eat foods that are rich in  calcium and vitamin D, and do weight-bearing exercises several times each week as directed by your health care provider. What should I know about how menopause affects my mental health? Depression may occur at any age, but it is more common as you become older. Common symptoms of depression include:  Low or sad mood.  Changes in sleep patterns.  Changes in appetite or eating patterns.  Feeling an overall lack of motivation or enjoyment of activities that you previously enjoyed.  Frequent crying spells.  Talk with your health care provider if you think that you are experiencing depression. What should I know about immunizations? It is important that you get and maintain your immunizations. These include:  Tetanus, diphtheria, and pertussis (  Tdap) booster vaccine.  Influenza every year before the flu season begins.  Pneumonia vaccine.  Shingles vaccine.  Your health care provider may also recommend other immunizations. This information is not intended to replace advice given to you by your health care provider. Make sure you discuss any questions you have with your health care provider. Document Released: 09/27/2005 Document Revised: 02/23/2016 Document Reviewed: 05/09/2015 Elsevier Interactive Patient Education  2018 [employer].

## 2018-03-10 NOTE — Progress Notes (Signed)
ANNUAL PREVENTATIVE CARE GYN  ENCOUNTER NOTE  Subjective:       Veronica Montes is a 52 y.o. G2P0 female here for a routine annual gynecologic exam.  Current complaints: 1.  Annual exam  Patient is status post [place].  History of uterine fibroids. Major interval health issues include recent rotator cuff surgery. Patient had screening colonoscopy this year which was negative. She is not experiencing any vasomotor symptoms or vaginal dryness.  She does continue to have occasional postcoital spotting.   Gynecologic History No LMP recorded. Patient has had a hysterectomy.  Strum Contraception: status post hysterectomy Last Pap: 2018 normal/negative Last mammogram: 06/30/2017 BI-RADS 1  Obstetric History OB History  Gravida Para Term Preterm AB Living  2         1  SAB TAB Ectopic Multiple Live Births          1    # Outcome Date GA Lbr Len/2nd Weight Sex Delivery Anes PTL Lv  2 Gravida 1997    F Vag-Spont     1 Gravida 1991    F Vag-Spont   LIV    Past Medical History:  Diagnosis Date  . Anxiety   . Arthritis   . History of kidney stones 1991  . Melanoma ([place]) 2011   Resected from Right upper chest area.    Past Surgical History:  Procedure Laterality Date  . ABDOMINAL HYSTERECTOMY  2002  . AUGMENTATION MAMMAPLASTY Bilateral 10+yrs ago  . BREAST BIOPSY Left 2014   bx/clip-neg  . COLONOSCOPY WITH PROPOFOL N/A 09/12/2016   Procedure: COLONOSCOPY WITH PROPOFOL;  Surgeon: Jonathon Bellows, MD;  [facility];  Service: Endoscopy;  Laterality: N/A;  . ESOPHAGOGASTRODUODENOSCOPY (EGD) WITH PROPOFOL N/A 09/12/2016   Procedure: ESOPHAGOGASTRODUODENOSCOPY (EGD) WITH PROPOFOL;  Surgeon: Jonathon Bellows, MD;  Location: [facility] ENDOSCOPY;  Service: Endoscopy;  Laterality: N/A;  . SHOULDER ARTHROSCOPY WITH OPEN ROTATOR CUFF REPAIR AND DISTAL CLAVICLE ACROMINECTOMY Left 08/15/2017   Procedure: SHOULDER ARTHROSCOPY WITH ROTATOR CUFF REPAIR AND SUBACROMINAL DECOMPRESSION;  Surgeon: Thornton Park, MD;  Location: [facility] ORS;  Service: Orthopedics;  Laterality: Left;  possible 23410 rotator cuff repair  . TUBAL LIGATION      Current Outpatient Medications on File Prior to Visit  Medication Sig Dispense Refill  . ibuprofen (ADVIL,MOTRIN) 200 MG tablet Take 800 mg by mouth every 6 (six) hours as needed for headache or moderate pain.    Marland Kitchen LORazepam (ATIVAN) 1 MG tablet Take 1 to 2 tablets at bedtime as needed (Patient taking differently: Take 1 mg by mouth 3 (three) times daily as needed for anxiety. ) 60 tablet 0  . meloxicam (MOBIC) 7.5 MG tablet   0  . traZODone (DESYREL) 150 MG tablet 150 mg at bedtime.   6   No current facility-administered medications on file prior to visit.     No Known Allergies  Social History: Sexual: heterosexual Marital Status: married Living situation: with family Occupation: hairdresser Tobacco/alcohol: no tobacco use Illicit drugs: no history of illicit drug use    Family History  Problem Relation Age of Onset  . Heart disease Brother   . Breast cancer Neg Hx     The following portions of the patient's history were reviewed and updated as appropriate: allergies, current medications, past family history, past medical history, past social history, past surgical history and problem list.  Review of Systems Review of Systems  Constitutional:       No vasomotor symptoms  HENT: Negative.   Eyes:  Negative.   Respiratory: Negative.   Cardiovascular: Negative.   Gastrointestinal: Negative.   Genitourinary: Negative.        Colonoscopy this year was normal  Musculoskeletal:       Status post rotator cuff surgery this year  Skin: Negative.   Neurological: Negative.   Endo/Heme/Allergies: Negative.   Psychiatric/Behavioral: Negative.      Objective:   BP 119/77   Pulse 96   Ht 5\' 3"  (1.6 m)   Wt 136 lb 5 oz (61.8 kg)   BMI 24.15 kg/m  CONSTITUTIONAL: Well-developed, well-nourished female in no acute distress.  PSYCHIATRIC:  Normal mood and affect. Normal behavior. Normal judgment and thought content. [place]: Alert and oriented to person, place, and time. Normal muscle tone coordination. No cranial nerve deficit noted. HENT:  Normocephalic, atraumatic, External right and left ear normal. EYES: Conjunctivae and EOM are normal. No scleral icterus.  NECK: Normal range of motion, supple, no masses.  Normal thyroid.  SKIN: Skin is warm and dry. No rash noted. Not diaphoretic. No erythema. No pallor. CARDIOVASCULAR: Normal heart rate noted, regular rhythm, no murmur. RESPIRATORY: Clear to auscultation bilaterally. Effort and breath sounds normal, no problems with respiration noted. BREASTS: Symmetric in size. No masses, skin changes, nipple drainage, or lymphadenopathy.  Breast implants present, intact ABDOMEN: Soft, no distention noted.  No tenderness, rebound or guarding.  BLADDER: Normal PELVIC:  External Genitalia: Normal  BUS: Normal  Vagina: Normal estrogen effect  Cervix: Normal parous without lesions; large eversion present; slightly friable; slight vaginal scarring posterior to cervix palpable  Uterus: Surgically absent  Adnexa: Normal; nonpalpable nontender  RV: Internal exam deferred due to colonoscopy this year and External Exam NormaI  MUSCULOSKELETAL: Normal range of motion. No tenderness.  No cyanosis, clubbing, or edema.  2+ distal pulses. LYMPHATIC: No Axillary, Supraclavicular, or Inguinal Adenopathy.    Assessment:   Annual gynecologic examination 52 y.o. Contraception: status post hysterectomy Normal BMI Problem List Items Addressed This Visit    None    Visit Diagnoses    Well woman exam    -  Primary   Screening for breast cancer       Relevant Orders   MM DIGITAL SCREENING BILATERAL   Status post laparoscopic supracervical hysterectomy          Plan:  Pap: Next Pap smear due 2021 and Not done Mammogram: Ordered Stool Guaiac Testing:  Not Ordered Labs: Done through primary  care-Dr. Doy Hutching Routine preventative health maintenance measures emphasized: Exercise/Diet/Weight control, Tobacco Warnings and Alcohol/Substance use risks Menopause symptoms reviewed Return to [place], MD   Note: This dictation was prepared with Dragon dictation along with smaller phrase technology. Any transcriptional errors that result from this process are unintentional.

## 2018-04-28 ENCOUNTER — Encounter: Payer: Self-pay | Admitting: Emergency Medicine

## 2018-04-28 ENCOUNTER — Emergency Department
Admission: EM | Admit: 2018-04-28 | Discharge: 2018-04-28 | Disposition: A | Discharge status: Home / Self Care | Payer: BLUE CROSS/BLUE SHIELD | Attending: Emergency Medicine | Admitting: Emergency Medicine

## 2018-04-28 ENCOUNTER — Emergency Department: Payer: BLUE CROSS/BLUE SHIELD

## 2018-04-28 DIAGNOSIS — Z79899 Other long term (current) drug therapy: Secondary | ICD-10-CM | POA: Diagnosis not present

## 2018-04-28 DIAGNOSIS — K2900 Acute gastritis without bleeding: Secondary | ICD-10-CM | POA: Diagnosis not present

## 2018-04-28 DIAGNOSIS — R1012 Left upper quadrant pain: Secondary | ICD-10-CM

## 2018-04-28 LAB — COMPREHENSIVE METABOLIC PANEL
ALBUMIN: 4.2 g/dL (ref 3.5–5.0)
ALT: 13 U/L (ref 0–44)
AST: 17 U/L (ref 15–41)
Alkaline Phosphatase: 53 U/L (ref 38–126)
Anion gap: 8 (ref 5–15)
BUN: 14 mg/dL (ref 6–20)
CHLORIDE: 104 mmol/L (ref 98–111)
CO2: 26 mmol/L (ref 22–32)
CREATININE: 0.73 mg/dL (ref 0.44–1.00)
Calcium: 8.9 mg/dL (ref 8.9–10.3)
GFR calc Af Amer: >60 mL/min (ref >60)
GFR calc non Af Amer: >60 mL/min (ref >60)
GLUCOSE: 115 mg/dL — AB (ref 70–99)
Potassium: 3.6 mmol/L (ref 3.5–5.1)
SODIUM: 138 mmol/L (ref 135–145)
Total Bilirubin: 0.5 mg/dL (ref 0.3–1.2)
Total Protein: 7.1 g/dL (ref 6.5–8.1)

## 2018-04-28 LAB — LIPASE, BLOOD: LIPASE: 45 U/L (ref 11–51)

## 2018-04-28 LAB — CBC
HCT: 36.6 % (ref 35.0–47.0)
Hemoglobin: 13.2 g/dL (ref 12.0–16.0)
MCH: 30.8 pg (ref 26.0–34.0)
MCHC: 35.9 g/dL (ref 32.0–36.0)
MCV: 85.8 fL (ref 80.0–100.0)
PLATELETS: 297 10*3/uL (ref 150–440)
RBC: 4.27 MIL/uL (ref 3.80–5.20)
RDW: 13.1 % (ref 11.5–14.5)
WBC: 6.2 10*3/uL (ref 3.6–11.0)

## 2018-04-28 LAB — URINALYSIS, COMPLETE (UACMP) WITH MICROSCOPIC
Bacteria, UA: NONE SEEN
Bilirubin Urine: NEGATIVE
GLUCOSE, UA: NEGATIVE mg/dL
HGB URINE DIPSTICK: NEGATIVE
Ketones, ur: NEGATIVE mg/dL
Nitrite: NEGATIVE
Protein, ur: NEGATIVE mg/dL
Specific Gravity, Urine: 1.005 (ref 1.005–1.030)
pH: 7 (ref 5.0–8.0)

## 2018-04-28 MED ORDER — IOPAMIDOL (ISOVUE-300) INJECTION 61%
100.0000 mL | Freq: Once | INTRAVENOUS | Status: AC | PRN
  Administered 2018-04-28: 100 mL via INTRAVENOUS
  Filled 2018-04-28: qty 100

## 2018-04-28 MED ORDER — GI COCKTAIL ~~LOC~~
30.0000 mL | ORAL | Status: AC
  Administered 2018-04-28: 30 mL via ORAL
  Filled 2018-04-28: qty 30

## 2018-04-28 MED ORDER — METOCLOPRAMIDE HCL 10 MG PO TABS: 10.0000 mg | ORAL_TABLET | Freq: Four times a day (QID) | ORAL | 0 refills | Status: DC | PRN

## 2018-04-28 MED ORDER — FAMOTIDINE 20 MG PO TABS: 20.0000 mg | ORAL_TABLET | Freq: Two times a day (BID) | ORAL | 0 refills | Status: DC

## 2018-04-28 MED ORDER — FAMOTIDINE IN NACL 20-0.9 MG/50ML-% IV SOLN
20.0000 mg | Freq: Once | INTRAVENOUS | Status: AC
  Administered 2018-04-28: 20 mg via INTRAVENOUS
  Filled 2018-04-28 (×2): qty 50

## 2018-04-28 MED ORDER — KETOROLAC TROMETHAMINE 30 MG/ML IJ SOLN
15.0000 mg | INTRAMUSCULAR | Status: AC
  Administered 2018-04-28: 15 mg via INTRAVENOUS
  Filled 2018-04-28: qty 1

## 2018-04-28 MED ORDER — ONDANSETRON 4 MG PO TBDP
4.0000 mg | ORAL_TABLET | Freq: Once | ORAL | Status: AC | PRN
  Administered 2018-04-28: 4 mg via ORAL
  Filled 2018-04-28: qty 1

## 2018-04-28 MED ORDER — SUCRALFATE 1 G PO TABS: 1.0000 g | ORAL_TABLET | Freq: Four times a day (QID) | ORAL | 1 refills | Status: DC

## 2018-04-28 MED ORDER — METOCLOPRAMIDE HCL 5 MG/ML IJ SOLN
10.0000 mg | Freq: Once | INTRAMUSCULAR | Status: AC
  Administered 2018-04-28: 10 mg via INTRAVENOUS
  Filled 2018-04-28: qty 2

## 2018-04-28 NOTE — ED Provider Notes (Signed)
[facility] Emergency Department Provider Note  ____________________________________________  Time seen: Approximately 8:24 PM  I have reviewed the triage vital signs and the nursing notes.   HISTORY  Chief Complaint Abdominal Pain and Back Pain    HPI Veronica Montes is a 52 y.o. female with a history of kidney stones and melanoma who complains of left upper quadrant abdominal pain radiating to the back that started yesterday, gradual onset, waxing and waning, constant.  Nausea, no vomiting or constipation .  No aggravating or alleviating factors.  No chest pain shortness of breath fevers or chills.  No dizziness or syncope.  Pain is rated as severe.  No black or bloody stool     Past Medical History:  Diagnosis Date  . Anxiety   . Arthritis   . History of kidney stones 1991  . Melanoma ([place]) 2011   Resected from Right upper chest area.     Patient Active Problem List   Diagnosis Date Noted  . Status post laparoscopic supracervical hysterectomy 03/10/2018  . Anxiety 10/08/2016  . Melanoma ([place]) 10/08/2016     Past Surgical History:  Procedure Laterality Date  . ABDOMINAL HYSTERECTOMY  2002  . AUGMENTATION MAMMAPLASTY Bilateral 10+yrs ago  . BREAST BIOPSY Left 2014   bx/clip-neg  . COLONOSCOPY WITH PROPOFOL N/A 09/12/2016   Procedure: COLONOSCOPY WITH PROPOFOL;  Surgeon: Jonathon Bellows, MD;  Location: [facility] ENDOSCOPY;  Service: Endoscopy;  Laterality: N/A;  . ESOPHAGOGASTRODUODENOSCOPY (EGD) WITH PROPOFOL N/A 09/12/2016   Procedure: ESOPHAGOGASTRODUODENOSCOPY (EGD) WITH PROPOFOL;  Surgeon: Jonathon Bellows, MD;  Location: [facility] ENDOSCOPY;  Service: Endoscopy;  Laterality: N/A;  . SHOULDER ARTHROSCOPY WITH OPEN ROTATOR CUFF REPAIR AND DISTAL CLAVICLE ACROMINECTOMY Left 08/15/2017   Procedure: SHOULDER ARTHROSCOPY WITH ROTATOR CUFF REPAIR AND SUBACROMINAL DECOMPRESSION;  Surgeon: [place] Park, MD;  Location: [facility] ORS;  Service: Orthopedics;  Laterality:  Left;  possible 23410 rotator cuff repair  . TUBAL LIGATION       Prior to Admission medications   Medication Sig Start Date End Date Taking? Authorizing Provider  famotidine (PEPCID) 20 MG tablet Take 1 tablet (20 mg total) by mouth 2 (two) times daily. 04/28/18   Carrie Mew, MD  ibuprofen (ADVIL,MOTRIN) 200 MG tablet Take 800 mg by mouth every 6 (six) hours as needed for headache or moderate pain.    [provider]  LORazepam (ATIVAN) 1 MG tablet Take 1 to 2 tablets at bedtime as needed Patient taking differently: Take 1 mg by mouth 3 (three) times daily as needed for anxiety.  04/14/15   Jerrol Banana., MD  meloxicam [facility]) 7.5 MG tablet  01/20/18   [provider]  metoCLOPramide (REGLAN) 10 MG tablet Take 1 tablet (10 mg total) by mouth every 6 (six) hours as needed. 04/28/18   Carrie Mew, MD  sucralfate (CARAFATE) 1 g tablet Take 1 tablet (1 g total) by mouth 4 (four) times daily. 04/28/18   Carrie Mew, MD  traZODone (DESYREL) 150 MG tablet 150 mg at bedtime.  07/26/16   [provider]     Allergies Patient has no known allergies.   Family History  Problem Relation Age of Onset  . Heart disease Brother   . Breast cancer Neg Hx     Social History Social History   Tobacco Use  . Smoking status: Never Smoker  . Smokeless tobacco: Never Used  Substance Use Topics  . Alcohol use: No  . Drug use: No    Review of Systems  Constitutional:   No fever or chills.  ENT:   No sore throat. No rhinorrhea. Cardiovascular:   No chest pain or syncope. Respiratory:   No dyspnea or cough. Gastrointestinal:   Positive as above for abdominal pain..  Musculoskeletal:   Negative for focal pain or swelling All other systems reviewed and are negative except as documented above in ROS and HPI.  ____________________________________________   PHYSICAL EXAM:  VITAL SIGNS: ED Triage Vitals  Enc Vitals Group     BP 04/28/18 1636 (!)  169/91     Pulse Rate 04/28/18 1636 77     Resp 04/28/18 1636 18     Temp 04/28/18 1636 98.1 F (36.7 C)     Temp Source 04/28/18 1636 Oral     SpO2 04/28/18 1636 99 %     Weight 04/28/18 1638 130 lb (59 kg)     Height 04/28/18 1638 5\' 3"  (1.6 m)     Head Circumference --      Peak Flow --      Pain Score 04/28/18 1638 8     Pain Loc --      Pain Edu? --      Excl. in [place]? --     Vital signs reviewed, nursing assessments reviewed.   Constitutional:   Alert and oriented. Non-toxic appearance. Eyes:   Conjunctivae are normal. EOMI. PERRL. ENT      Head:   Normocephalic and atraumatic.      Nose:   No congestion/rhinnorhea.       Mouth/Throat:   MMM, positive pharyngeal erythema. No peritonsillar mass.       Neck:   No meningismus. Full ROM. Hematological/Lymphatic/Immunilogical:   No cervical lymphadenopathy. Cardiovascular:   RRR. Symmetric bilateral radial and DP pulses.  No murmurs. Cap refill less than 2 seconds. Respiratory:   Normal respiratory effort without tachypnea/retractions. Breath sounds are clear and equal bilaterally. No wheezes/rales/rhonchi. Gastrointestinal:   Soft with left upper quadrant tenderness. Non distended. There is no CVA tenderness.  No rebound, rigidity, or guarding.  Musculoskeletal:   Normal range of motion in all extremities. No joint effusions.  No lower extremity tenderness.  No edema. Neurologic:   Normal speech and language.  Motor grossly intact. No acute focal neurologic deficits are appreciated.  Skin:    Skin is warm, dry and intact. No rash noted.  No petechiae, purpura, or bullae.  ____________________________________________    LABS (pertinent positives/negatives) (all labs ordered are listed, but only abnormal results are displayed) Labs Reviewed  COMPREHENSIVE METABOLIC PANEL - Abnormal; Notable for the following components:      Result Value   Glucose, Bld 115 (*)    All other components within normal limits  URINALYSIS,  COMPLETE (UACMP) WITH MICROSCOPIC - Abnormal; Notable for the following components:   Color, Urine STRAW (*)    APPearance CLEAR (*)    Leukocytes, UA TRACE (*)    All other components within normal limits  LIPASE, BLOOD  CBC   ____________________________________________   EKG  Interpreted by me  Date: 04/28/2018  Rate: 79  Rhythm: normal sinus rhythm  QRS Axis: normal  Intervals: normal  ST/T Wave abnormalities: normal  Conduction Disutrbances: none  Narrative Interpretation: unremarkable      ____________________________________________    RADIOLOGY  Ct Abdomen Pelvis W Contrast  Result Date: 04/28/2018 CLINICAL DATA:  Left abdominal and flank pain beginning yesterday. Nausea and diarrhea. EXAM: CT ABDOMEN AND PELVIS WITH CONTRAST TECHNIQUE: Multidetector CT imaging of the abdomen  and pelvis was performed using the standard protocol following bolus administration of intravenous contrast. CONTRAST:  154mL ISOVUE-300 IOPAMIDOL (ISOVUE-300) INJECTION 61% COMPARISON:  10/15/2016 FINDINGS: Lower Chest: No acute findings. Hepatobiliary: No hepatic masses identified. Gallbladder is unremarkable. Pancreas:  No mass or inflammatory changes. Spleen: Within normal limits in size and appearance. Adrenals/Urinary Tract: No masses identified. No evidence of hydronephrosis. Stomach/Bowel: No evidence of obstruction, inflammatory process or abnormal fluid collections. Normal appendix visualized. Vascular/Lymphatic: No pathologically enlarged lymph nodes. No abdominal aortic aneurysm. Reproductive: Prior supracervical hysterectomy noted. No pelvic mass, inflammatory process, or abnormal fluid collections identified. Other:  None. Musculoskeletal:  No suspicious bone lesions identified. IMPRESSION: Negative.  No acute findings or other significant abnormality. Electronically Signed   By: Earle Gell M.D.   On: 04/28/2018 19:55     ____________________________________________   PROCEDURES Procedures  ____________________________________________  DIFFERENTIAL DIAGNOSIS   Pancreatitis, bowel perforation, colitis, peptic ulcer disease, gastritis  CLINICAL IMPRESSION / ASSESSMENT AND PLAN / ED COURSE  Pertinent labs & imaging results that were available during my care of the patient were reviewed by me and considered in my medical decision making (see chart for details).    Patient presents with left upper quadrant pain radiating to the back.  Significant tenderness on exam.  Vital signs are unremarkable, labs are normal, no relief with antacids at home.  Due to the significance of her symptoms I will get a CT scan.  Notably she had a CT scan a year and a half ago for left upper quadrant pain which was also unremarkable.  Doubt GI bleed.  ----------------------------------------- 8:25 PM on 04/28/2018 -----------------------------------------  CT negative, no acute findings.  Continue Carafate Reglan Pepcid at home follow-up with primary care.  Follow-up with gastroenterology in 1 week if symptoms have not resolved.      ____________________________________________   FINAL CLINICAL IMPRESSION(S) / ED DIAGNOSES    Final diagnoses:  Acute LUQ pain  Acute gastritis without hemorrhage, unspecified gastritis type     ED Discharge Orders         Ordered    sucralfate (CARAFATE) 1 g tablet  4 times daily     04/28/18 2023    famotidine (PEPCID) 20 MG tablet  2 times daily     04/28/18 2023    metoCLOPramide (REGLAN) 10 MG tablet  Every 6 hours PRN     04/28/18 2023          Portions of this note were generated with dragon dictation software. Dictation errors may occur despite best attempts at proofreading.    Carrie Mew, MD 04/28/18 2027

## 2018-04-28 NOTE — ED Triage Notes (Signed)
Pt arrived with complaints of upper left sided abdominal pain that radiated to her back. Pt states the pain started yesterday. Pt reports the pain as a discomfort. PT denies vomiting but does report nausea, and 4 episodes of diarrhea. No recent antibiotic treatment

## 2018-04-28 NOTE — ED Notes (Signed)
Pt ambulatory to POV without difficulty. VSS. NAD. Discharge instructions, RX and follow up reviewed. All questions and concerns addressed.  

## 2018-04-28 NOTE — ED Notes (Signed)
Patient transported to CT 

## 2018-04-28 NOTE — ED Notes (Signed)
ED Provider at bedside. 

## 2018-04-28 NOTE — ED Notes (Signed)
Nurse turned down lights per pt's request.

## 2018-04-28 NOTE — Discharge Instructions (Signed)
Your lab tests and CT scan were normal today.  There is no clear cause of your symptoms, but it is most likely stomach inflammation.  Take the prescribed medications to soothe your stomach and decrease stomach acid until it can heal.

## 2018-05-18 ENCOUNTER — Ambulatory Visit (INDEPENDENT_AMBULATORY_CARE_PROVIDER_SITE_OTHER): Payer: BLUE CROSS/BLUE SHIELD | Admitting: Gastroenterology

## 2018-05-18 ENCOUNTER — Encounter: Payer: Self-pay | Admitting: Gastroenterology

## 2018-05-18 VITALS — BP 121/75 | HR 79 | Ht 63.0 in | Wt 136.8 lb

## 2018-05-18 DIAGNOSIS — Z791 Long term (current) use of non-steroidal anti-inflammatories (NSAID): Secondary | ICD-10-CM

## 2018-05-18 DIAGNOSIS — R1012 Left upper quadrant pain: Secondary | ICD-10-CM

## 2018-05-18 NOTE — Progress Notes (Signed)
Jonathon Bellows MD, MRCP(U.K) [address]  [place]  [place], [place] [address]  Main: [phone]  Fax: [phone]   Primary Care Physician: Idelle Crouch, MD  Primary Gastroenterologist:  Dr. Jonathon Bellows   Chief Complaint  Patient presents with  . Hospitalization Follow-up    Abdominal Pain    HPI: Veronica Montes is a 52 y.o. female referred for abdominal pain. I had seen her back in 08/2016 at my office for abdominal pain. The pain had been ongoing for 6 weeks at that time , all day long , left upper part of the abdomen . Had been on a lot of NSAID's at that time , my impression was that it was likely secondary due to effects of NSAID's .  I had performed an EGD on 09/12/16 - normal , colonoscopy was also normal .   CT abdomen 04/28/18 was normal . She presented to the ER on 04/28/18 with acute LUQ pain , it had began the day prior. She had normal labs including CBC,CMP,Lipase. .  She says she is still hurting. She says that the abdominal pain began 1 week before ER, LUQ, more continious, all day long , did not wake her up in the night from sleep. Sometimes radiates to the right side. Nothing makes it worse, no relieving factors. Just prior to ER visit was taking 800 mg ibuprofen and meloxicam for months. Been on these meds since 09/2017 till ER visit. Denies any vomiting but has some nausea. Denies any weight loss. For pain right now takes tylenol.   She is on protonix 40 mg BID, takes it before food, also taking carafate QID , helps when she takes it.     Current Outpatient Medications  Medication Sig Dispense Refill  . LORazepam (ATIVAN) 1 MG tablet Take 1 to 2 tablets at bedtime as needed (Patient taking differently: Take 1 mg by mouth 3 (three) times daily as needed for anxiety. ) 60 tablet 0  . sucralfate (CARAFATE) 1 g tablet Take 1 tablet (1 g total) by mouth 4 (four) times daily. 120 tablet 1  . traZODone (DESYREL) 150 MG tablet 150 mg at bedtime.   6  .  famotidine (PEPCID) 20 MG tablet Take 1 tablet (20 mg total) by mouth 2 (two) times daily. (Patient not taking: Reported on 05/18/2018) 60 tablet 0  . ibuprofen (ADVIL,MOTRIN) 200 MG tablet Take 800 mg by mouth every 6 (six) hours as needed for headache or moderate pain.    . meloxicam (MOBIC) 7.5 MG tablet   0  . metoCLOPramide (REGLAN) 10 MG tablet Take 1 tablet (10 mg total) by mouth every 6 (six) hours as needed. (Patient not taking: Reported on 05/18/2018) 30 tablet 0   No current facility-administered medications for this visit.     Allergies as of 05/18/2018  . (No Known Allergies)    ROS:  General: Negative for anorexia, weight loss, fever, chills, fatigue, weakness. ENT: Negative for hoarseness, difficulty swallowing , nasal congestion. CV: Negative for chest pain, angina, palpitations, dyspnea on exertion, peripheral edema.  Respiratory: Negative for dyspnea at rest, dyspnea on exertion, cough, sputum, wheezing.  GI: See history of present illness. GU:  Negative for dysuria, hematuria, urinary incontinence, urinary frequency, nocturnal urination.  Endo: Negative for unusual weight change.    Physical Examination:   BP 121/75   Pulse 79   Ht 5\' 3"  (1.6 m)   Wt 136 lb 12.8 oz (62.1 kg)   BMI 24.23 kg/m  General: Well-nourished, well-developed in no acute distress.  Eyes: No icterus. Conjunctivae pink. Mouth: Oropharyngeal mucosa moist and pink , no lesions erythema or exudate. Lungs: Clear to auscultation bilaterally. Non-labored. Heart: Regular rate and rhythm, no murmurs rubs or gallops.  Abdomen: Bowel sounds are normal, nontender, nondistended, no hepatosplenomegaly or masses, no abdominal bruits or hernia , no rebound or guarding.   Extremities: No lower extremity edema. No clubbing or deformities. Neuro: Alert and oriented x 3.  Grossly intact. Skin: Warm and dry, no jaundice.   Psych: Alert and cooperative, normal mood and affect.   Imaging Studies: Ct  Abdomen Pelvis W Contrast  Result Date: 04/28/2018 CLINICAL DATA:  Left abdominal and flank pain beginning yesterday. Nausea and diarrhea. EXAM: CT ABDOMEN AND PELVIS WITH CONTRAST TECHNIQUE: Multidetector CT imaging of the abdomen and pelvis was performed using the standard protocol following bolus administration of intravenous contrast. CONTRAST:  192mL ISOVUE-300 IOPAMIDOL (ISOVUE-300) INJECTION 61% COMPARISON:  10/15/2016 FINDINGS: Lower Chest: No acute findings. Hepatobiliary: No hepatic masses identified. Gallbladder is unremarkable. Pancreas:  No mass or inflammatory changes. Spleen: Within normal limits in size and appearance. Adrenals/Urinary Tract: No masses identified. No evidence of hydronephrosis. Stomach/Bowel: No evidence of obstruction, inflammatory process or abnormal fluid collections. Normal appendix visualized. Vascular/Lymphatic: No pathologically enlarged lymph nodes. No abdominal aortic aneurysm. Reproductive: Prior supracervical hysterectomy noted. No pelvic mass, inflammatory process, or abnormal fluid collections identified. Other:  None. Musculoskeletal:  No suspicious bone lesions identified. IMPRESSION: Negative.  No acute findings or other significant abnormality. Electronically Signed   By: Earle Gell M.D.   On: 04/28/2018 19:55    Assessment and Plan:   Veronica Montes is a 52 y.o. y/o female referred back to see me for LUQ pain, previously in 2018 had the same issue which resolved after she stopped NSAID's, she subsequently restarted NSAID's in 08/2017 with recurrence of symptoms. She has since stopped all NSAID and I expect her symptoms to resolve over the next few weeks. Labs and imaging are normal. Likely NSAID related mucosal injury.   Plan  1. IB guard samples for pain  2. Continue PPI and carafate  3. H pylori breath test     Dr Jonathon Bellows  MD,MRCP [facility]) Follow up in 3-4 weeks

## 2018-05-20 ENCOUNTER — Encounter: Payer: Self-pay | Admitting: Gastroenterology

## 2018-05-20 LAB — H. PYLORI BREATH TEST: H pylori Breath Test: NEGATIVE

## 2018-05-24 ENCOUNTER — Encounter: Payer: Self-pay | Admitting: Gastroenterology

## 2018-06-26 ENCOUNTER — Ambulatory Visit: Payer: BLUE CROSS/BLUE SHIELD | Admitting: Gastroenterology

## 2018-06-29 ENCOUNTER — Ambulatory Visit: Payer: BLUE CROSS/BLUE SHIELD | Admitting: Gastroenterology

## 2018-07-06 ENCOUNTER — Ambulatory Visit
Admission: RE | Admit: 2018-07-06 | Discharge: 2018-07-06 | Disposition: A | Discharge status: Home / Self Care | Payer: BLUE CROSS/BLUE SHIELD | Source: Ambulatory Visit | Attending: Obstetrics and Gynecology | Admitting: Obstetrics and Gynecology

## 2018-07-06 DIAGNOSIS — Z1239 Encounter for other screening for malignant neoplasm of breast: Secondary | ICD-10-CM | POA: Diagnosis present

## 2018-07-09 ENCOUNTER — Telehealth: Payer: Self-pay | Admitting: Obstetrics and Gynecology

## 2018-07-09 ENCOUNTER — Ambulatory Visit (INDEPENDENT_AMBULATORY_CARE_PROVIDER_SITE_OTHER): Payer: BLUE CROSS/BLUE SHIELD

## 2018-07-09 ENCOUNTER — Other Ambulatory Visit: Payer: Self-pay | Admitting: Obstetrics and Gynecology

## 2018-07-09 DIAGNOSIS — R1012 Left upper quadrant pain: Secondary | ICD-10-CM

## 2018-07-09 DIAGNOSIS — N83202 Unspecified ovarian cyst, left side: Secondary | ICD-10-CM

## 2018-07-09 NOTE — Telephone Encounter (Signed)
Patient called with complaints of Left upper quadrant pain. She would like to be reschedule for u/s. Thanks

## 2018-07-21 ENCOUNTER — Other Ambulatory Visit: Payer: BLUE CROSS/BLUE SHIELD

## 2018-07-30 ENCOUNTER — Ambulatory Visit: Payer: BLUE CROSS/BLUE SHIELD | Admitting: Gastroenterology

## 2019-03-26 ENCOUNTER — Other Ambulatory Visit: Payer: Self-pay | Admitting: Internal Medicine

## 2019-03-26 DIAGNOSIS — Z1231 Encounter for screening mammogram for malignant neoplasm of breast: Secondary | ICD-10-CM

## 2019-05-04 ENCOUNTER — Telehealth: Payer: Self-pay | Admitting: *Deleted

## 2019-05-04 NOTE — Telephone Encounter (Signed)
Pt would like referral for her back pain

## 2019-05-05 ENCOUNTER — Other Ambulatory Visit: Payer: Self-pay | Admitting: Obstetrics and Gynecology

## 2019-05-05 DIAGNOSIS — M25551 Pain in right hip: Secondary | ICD-10-CM

## 2019-05-18 ENCOUNTER — Other Ambulatory Visit: Payer: Self-pay | Admitting: Internal Medicine

## 2019-05-18 ENCOUNTER — Encounter: Payer: BLUE CROSS/BLUE SHIELD | Admitting: Obstetrics and Gynecology

## 2019-05-18 DIAGNOSIS — M5442 Lumbago with sciatica, left side: Secondary | ICD-10-CM

## 2019-05-24 ENCOUNTER — Ambulatory Visit
Admission: RE | Admit: 2019-05-24 | Discharge: 2019-05-24 | Disposition: A | Discharge status: Home / Self Care | Payer: PRIVATE HEALTH INSURANCE | Source: Ambulatory Visit | Attending: Internal Medicine | Admitting: Internal Medicine

## 2019-05-24 ENCOUNTER — Other Ambulatory Visit: Payer: Self-pay

## 2019-05-24 DIAGNOSIS — M5442 Lumbago with sciatica, left side: Secondary | ICD-10-CM | POA: Diagnosis not present

## 2019-06-03 ENCOUNTER — Ambulatory Visit: Payer: BLUE CROSS/BLUE SHIELD | Admitting: Family Medicine

## 2019-07-08 ENCOUNTER — Inpatient Hospital Stay: Admission: RE | Admit: 2019-07-08 | Payer: BLUE CROSS/BLUE SHIELD | Source: Ambulatory Visit

## 2019-07-21 ENCOUNTER — Encounter: Payer: BLUE CROSS/BLUE SHIELD | Admitting: Obstetrics and Gynecology

## 2019-08-16 ENCOUNTER — Telehealth: Payer: Self-pay | Admitting: Certified Nurse Midwife

## 2019-08-16 NOTE — Telephone Encounter (Signed)
Pt was called to pre-screen before her appt pt stated that she has be exposed. To covid on 12/19 her appt was 12/29. Informed pt we will need to reschud. Pt was I little upset bc she has to resch because mel left and now this. I told pt thank you for being honest with me and she was fine once we got off the phone

## 2019-08-17 ENCOUNTER — Encounter: Payer: Self-pay | Admitting: Certified Nurse Midwife

## 2019-09-20 IMAGING — CT CT ABD-PELV W/ CM
2 of 5 series · 16 of 46 positions shown, 18 images · IV contrast (APPLIED)
Comparison: 10/15/2016

CLINICAL DATA: Left abdominal and flank pain beginning yesterday.
Nausea and diarrhea.

EXAM:
CT ABDOMEN AND PELVIS WITH CONTRAST
TECHNIQUE: Multidetector CT imaging of the abdomen and pelvis was performed
using the standard protocol following bolus administration of
intravenous contrast.
CONTRAST:  100mL [id number] IOPAMIDOL ([id number]-O11) INJECTION 61%

[Series 2: routine abd/pel with · axial · 0.80mm/px · z∈[-1020,-650]mm · 13 of 84 slices shown, 15 images]
[im 5/84  soft-tissue]
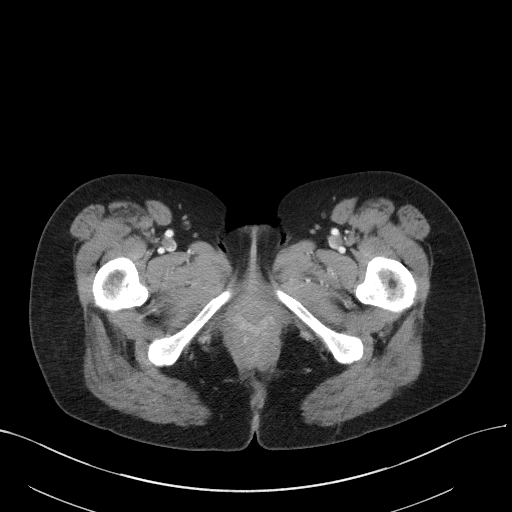
[im 5/84  bone]
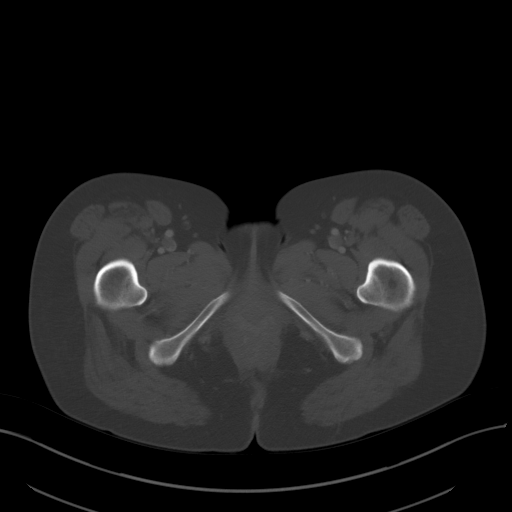
[im 13/84  soft-tissue]
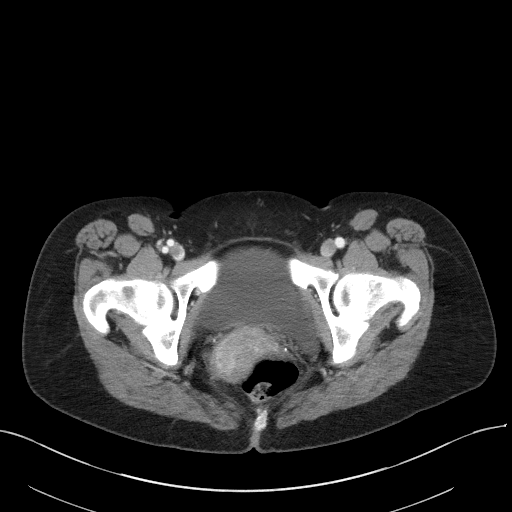
[im 17/84  soft-tissue]
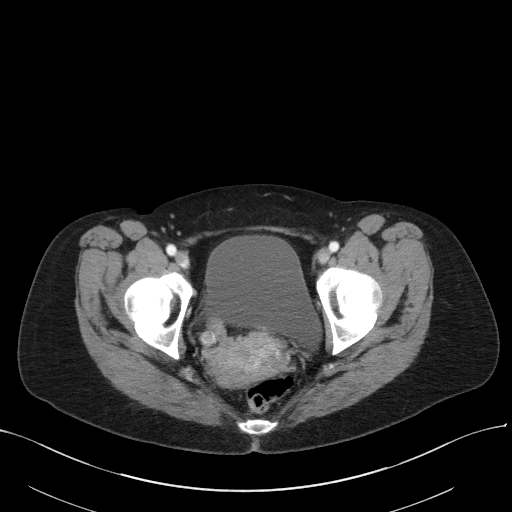
[im 25/84  soft-tissue]
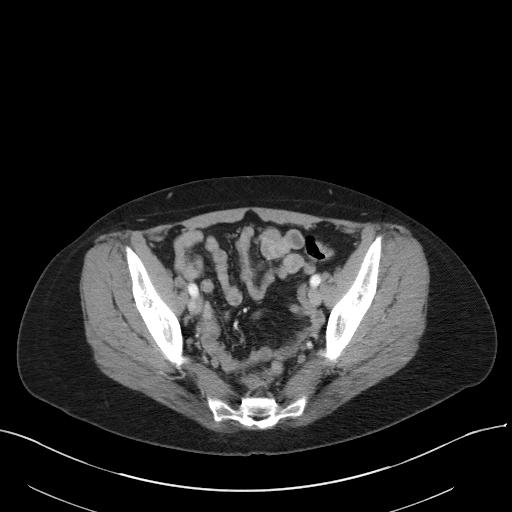
[im 30/84  soft-tissue]
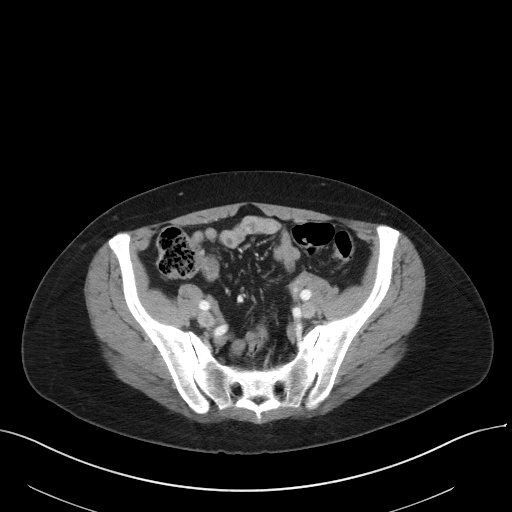
[im 38/84  soft-tissue]
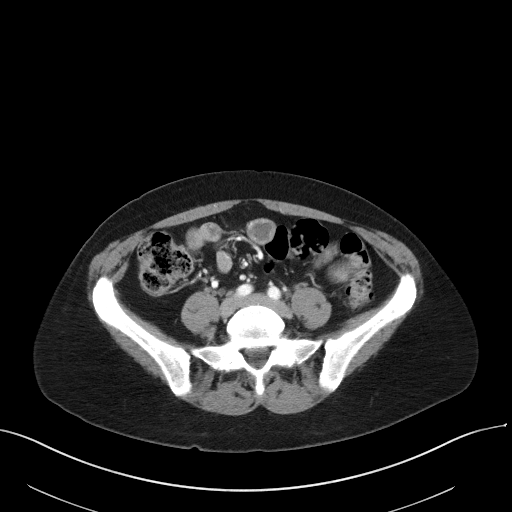
[im 42/84  soft-tissue]
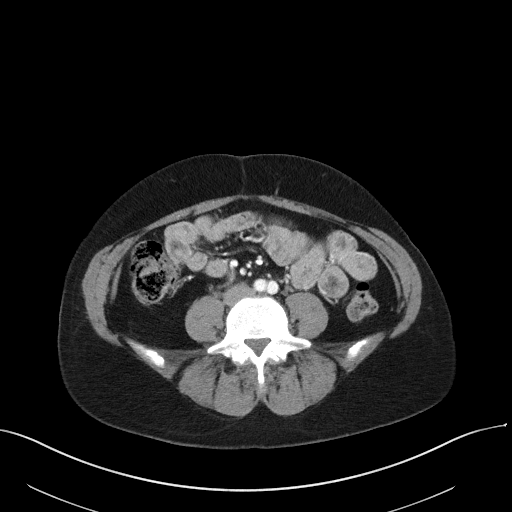
[im 46/84  soft-tissue]
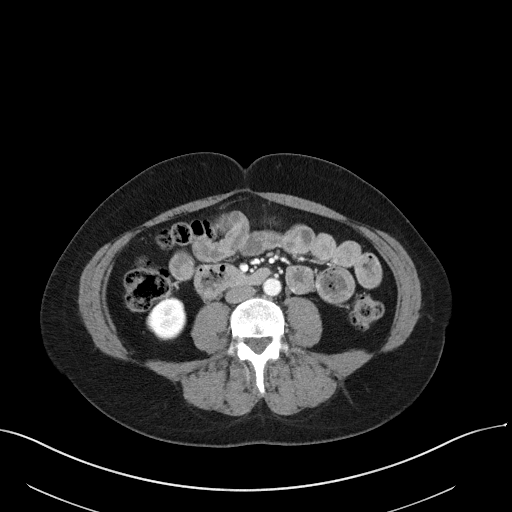
[im 54/84  soft-tissue]
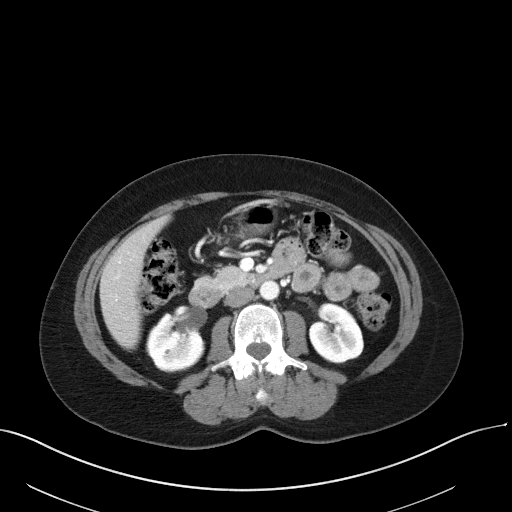
[im 54/84  bone]
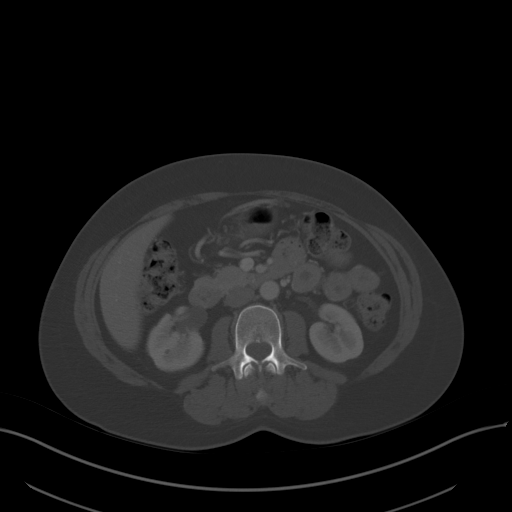
[im 59/84  soft-tissue]
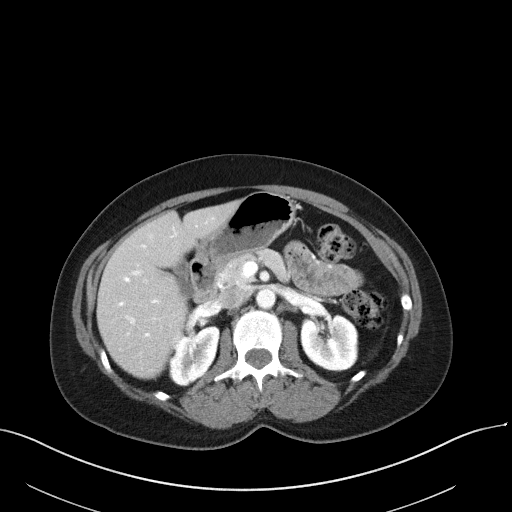
[im 67/84  soft-tissue]
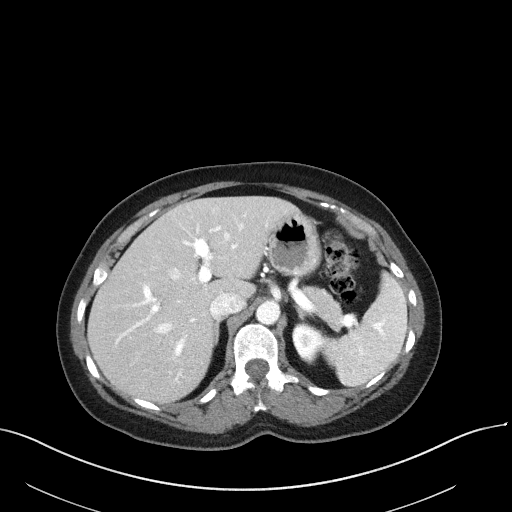
[im 71/84  soft-tissue]
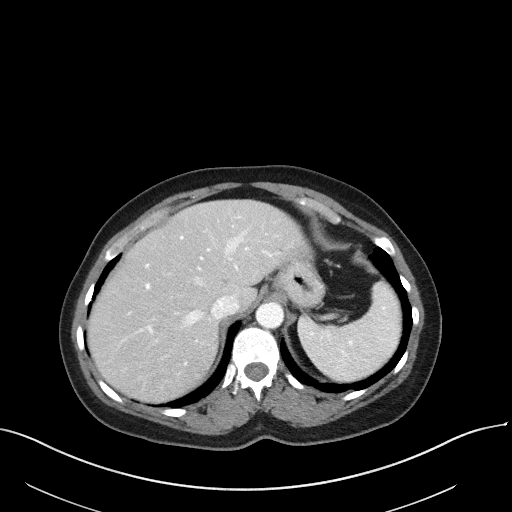
[im 79/84  soft-tissue]
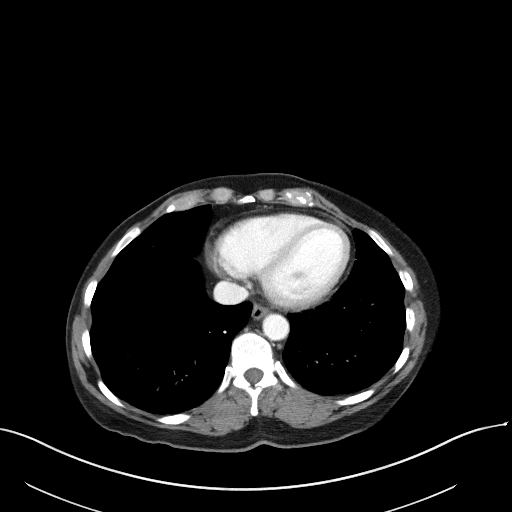

[Series 5: coronal st · coronal · 0.59mm/px · 3 of 70 slices shown]
[im 24/70  soft-tissue]
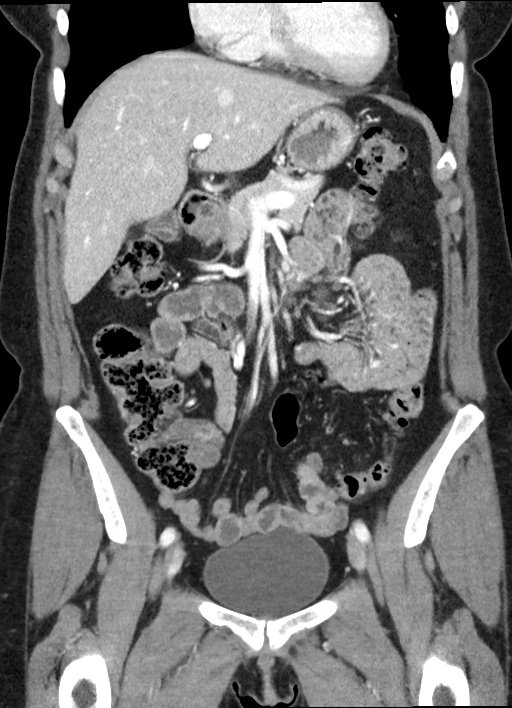
[im 31/70  soft-tissue]
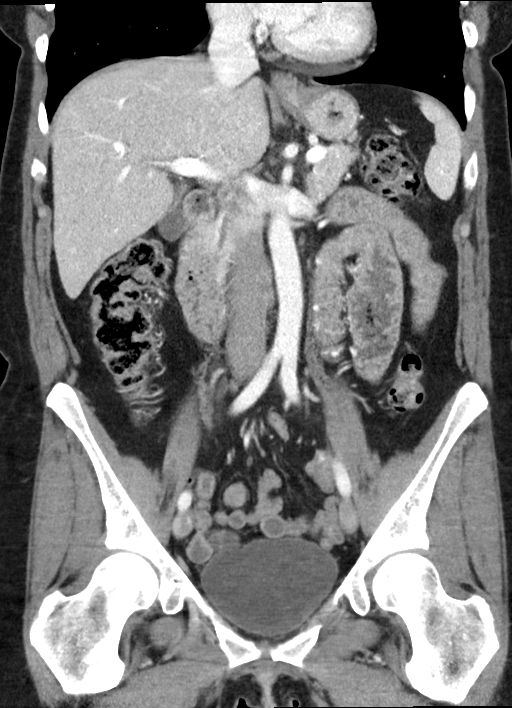
[im 39/70  soft-tissue]
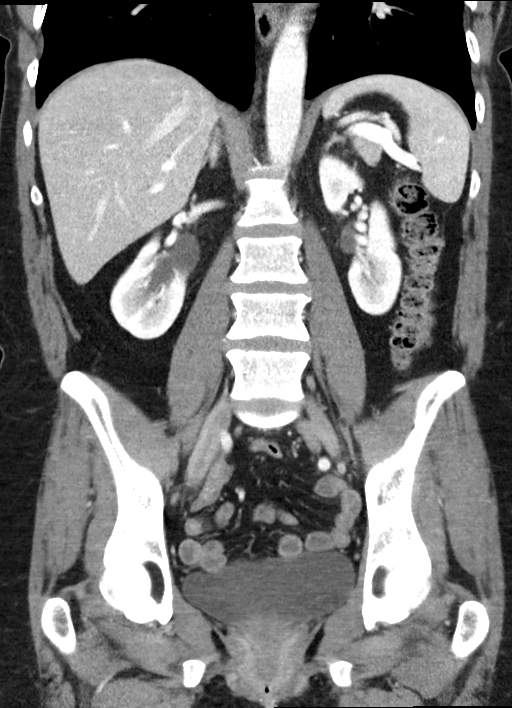

[16 of 46 positions shown; findings below may reference images not displayed]

FINDINGS: Lower Chest: No acute findings.

Hepatobiliary: No hepatic masses identified. Gallbladder is
unremarkable.

Pancreas:  No mass or inflammatory changes.

Spleen: Within normal limits in size and appearance.

Adrenals/Urinary Tract: No masses identified. No evidence of
hydronephrosis.

Stomach/Bowel: No evidence of obstruction, inflammatory process or
abnormal fluid collections. Normal appendix visualized.

Vascular/Lymphatic: No pathologically enlarged lymph nodes. No
abdominal aortic aneurysm.

Reproductive: Prior supracervical hysterectomy noted. No pelvic
mass, inflammatory process, or abnormal fluid collections
identified.

Other:  None.

Musculoskeletal:  No suspicious bone lesions identified.
IMPRESSION: Negative.  No acute findings or other significant abnormality.

## 2019-10-04 ENCOUNTER — Ambulatory Visit
Admission: RE | Admit: 2019-10-04 | Discharge: 2019-10-04 | Disposition: A | Discharge status: Home / Self Care | Payer: 59 | Source: Ambulatory Visit | Attending: Internal Medicine | Admitting: Internal Medicine

## 2019-10-04 ENCOUNTER — Other Ambulatory Visit: Payer: Self-pay

## 2019-10-04 DIAGNOSIS — Z1231 Encounter for screening mammogram for malignant neoplasm of breast: Secondary | ICD-10-CM | POA: Insufficient documentation

## 2019-10-08 ENCOUNTER — Encounter: Payer: Self-pay | Admitting: Certified Nurse Midwife

## 2019-10-19 ENCOUNTER — Ambulatory Visit (INDEPENDENT_AMBULATORY_CARE_PROVIDER_SITE_OTHER): Payer: 59 | Admitting: Certified Nurse Midwife

## 2019-10-19 ENCOUNTER — Other Ambulatory Visit: Payer: Self-pay

## 2019-10-19 ENCOUNTER — Encounter: Payer: Self-pay | Admitting: Certified Nurse Midwife

## 2019-10-19 VITALS — BP 106/71 | HR 68 | Ht 63.0 in | Wt 133.4 lb

## 2019-10-19 DIAGNOSIS — N898 Other specified noninflammatory disorders of vagina: Secondary | ICD-10-CM | POA: Diagnosis not present

## 2019-10-19 DIAGNOSIS — Z113 Encounter for screening for infections with a predominantly sexual mode of transmission: Secondary | ICD-10-CM | POA: Diagnosis not present

## 2019-10-19 DIAGNOSIS — Z01419 Encounter for gynecological examination (general) (routine) without abnormal findings: Secondary | ICD-10-CM | POA: Diagnosis not present

## 2019-10-19 NOTE — Progress Notes (Signed)
GYNECOLOGY ANNUAL PREVENTATIVE CARE ENCOUNTER NOTE  History:     Veronica Montes is a 54 y.o. G2P0 female here for a routine annual gynecologic exam.  Current complaints: mild vaginal dryness occasional hot episodes nothing that is to bothersome. .   Denies abnormal vaginal bleeding, discharge, pelvic pain, problems with intercourse or other gynecologic concerns.     Social Married with 2 children Lives at home with husband and has 54 yr old foster daughter [employer]oks-owns Teacher, music , Emergency planning/management officer Exercise few x month Denies smoking, drinking, drug use  Gynecologic History No LMP recorded. Patient has had a hysterectomy. Contraception: status post hysterectomy, cervix and ovaries present Last Pap: 2018. Results were: normal with negative HPV Last mammogram: 10/04/19. Results were: normal  Obstetric History OB History  Gravida Para Term Preterm AB Living  2         1  SAB TAB Ectopic Multiple Live Births          1    # Outcome Date GA Lbr Len/2nd Weight Sex Delivery Anes PTL Lv  2 Gravida 1997    F Vag-Spont     1 Gravida 1991    F Vag-Spont   LIV    Past Medical History:  Diagnosis Date  . Anxiety   . Arthritis   . History of kidney stones 1991  . Melanoma ([employer]) 2011   Resected from Right upper chest area.    Past Surgical History:  Procedure Laterality Date  . ABDOMINAL HYSTERECTOMY  2002  . AUGMENTATION MAMMAPLASTY Bilateral 10+yrs ago  . BREAST BIOPSY Left 2014   bx/clip-neg  . COLONOSCOPY WITH PROPOFOL N/A 09/12/2016   Procedure: COLONOSCOPY WITH PROPOFOL;  Surgeon: Jonathon Bellows, MD;  Location: [facility] ENDOSCOPY;  Service: Endoscopy;  Laterality: N/A;  . ESOPHAGOGASTRODUODENOSCOPY (EGD) WITH PROPOFOL N/A 09/12/2016   Procedure: ESOPHAGOGASTRODUODENOSCOPY (EGD) WITH PROPOFOL;  Surgeon: Jonathon Bellows, MD;  Location: [facility] ENDOSCOPY;  Service: Endoscopy;  Laterality: N/A;  . SHOULDER ARTHROSCOPY WITH OPEN ROTATOR CUFF REPAIR AND DISTAL CLAVICLE ACROMINECTOMY Left 08/15/2017   Procedure: SHOULDER ARTHROSCOPY WITH ROTATOR CUFF REPAIR AND SUBACROMINAL DECOMPRESSION;  Surgeon: Thornton Park, MD;  Location: [facility] ORS;  Service: Orthopedics;  Laterality: Left;  possible 23410 rotator cuff repair  . TUBAL LIGATION      Current Outpatient Medications on File Prior to Visit  Medication Sig Dispense Refill  . celecoxib (CELEBREX) 200 MG capsule Take 200 mg by mouth 2 (two) times daily.    Marland Kitchen ibuprofen (ADVIL,MOTRIN) 200 MG tablet Take 800 mg by mouth every 6 (six) hours as needed for headache or moderate pain.    Marland Kitchen LORazepam (ATIVAN) 1 MG tablet Take 1 to 2 tablets at bedtime as needed (Patient taking differently: Take 1 mg by mouth 3 (three) times daily as needed for anxiety. ) 60 tablet 0  . famotidine (PEPCID) 20 MG tablet Take 1 tablet (20 mg total) by mouth 2 (two) times daily. (Patient not taking: Reported on 05/18/2018) 60 tablet 0  . meloxicam (MOBIC) 7.5 MG tablet   0  . metoCLOPramide (REGLAN) 10 MG tablet Take 1 tablet (10 mg total) by mouth every 6 (six) hours as needed. (Patient not taking: Reported on 05/18/2018) 30 tablet 0  . sucralfate (CARAFATE) 1 g tablet Take 1 tablet (1 g total) by mouth 4 (four) times daily. (Patient not taking: Reported on 10/19/2019) 120 tablet 1  . traZODone (DESYREL) 150 MG tablet 150 mg at bedtime.   6   No current facility-administered medications  on file prior to visit.    No Known Allergies  Social History:  reports that she has never smoked. She has never used smokeless tobacco. She reports that she does not drink alcohol or use drugs.  Family History  Problem Relation Age of Onset  . Heart disease Brother   . Breast cancer Neg Hx     The following portions of the patient's history were reviewed and updated as appropriate: allergies, current medications, past family history, past medical history, past social history, past surgical history and problem list.  Review of Systems Pertinent items noted in HPI and remainder of  comprehensive ROS otherwise negative.  Physical Exam:  BP 106/71   Pulse 68   Ht 5\' 3"  (1.6 m)   Wt 133 lb 6 oz (60.5 kg)   BMI 23.63 kg/m  CONSTITUTIONAL: Well-developed, well-nourished female in no acute distress.  HENT:  Normocephalic, atraumatic, External right and left ear normal. Oropharynx is clear and moist EYES: Conjunctivae and EOM are normal. Pupils are equal, round, and reactive to light. No scleral icterus.  NECK: Normal range of motion, supple, no masses.  Normal thyroid.  SKIN: Skin is warm and dry. No rash noted. Not diaphoretic. No erythema. No pallor. MUSCULOSKELETAL: Normal range of motion. No tenderness.  No cyanosis, clubbing, or edema.  2+ distal pulses. NEUROLOGIC: Alert and oriented to person, place, and time. Normal reflexes, muscle tone coordination.  PSYCHIATRIC: Normal mood and affect. Normal behavior. Normal judgment and thought content. CARDIOVASCULAR: Normal heart rate noted, regular rhythm RESPIRATORY: Clear to auscultation bilaterally. Effort and breath sounds normal, no problems with respiration noted. BREASTS: Symmetric in size. No masses, tenderness, skin changes, nipple drainage, or lymphadenopathy bilaterally. ABDOMEN: Soft, no distention noted.  No tenderness, rebound or guarding.  PELVIC: Normal appearing external genitalia and urethral meatus; normal appearing vaginal mucosa and cervix.  No abnormal discharge noted.  Pap smear not due   Uterus-absent  no other palpable masses  or adnexal tenderness.   Assessment and Plan:    1. Women's annual routine gynecological examination  Pap not indicated Mammogram done 2021 Labs: HIV, FSH Medication: none  Routine preventative health maintenance measures emphasized. Encouraged use of vaginal moisturizer as needed for dryness. Please refer to After Visit Summary for other counseling recommendations.      Philip Aspen, CNM

## 2019-10-19 NOTE — Patient Instructions (Signed)

## 2019-10-20 LAB — FOLLICLE STIMULATING HORMONE: FSH: 32.6 m[IU]/mL

## 2019-10-20 LAB — HIV ANTIBODY (ROUTINE TESTING W REFLEX): HIV Screen 4th Generation wRfx: NONREACTIVE

## 2019-11-16 ENCOUNTER — Ambulatory Visit: Payer: 59 | Admitting: Student in an Organized Health Care Education/Training Program

## 2019-11-26 ENCOUNTER — Emergency Department
Admission: EM | Admit: 2019-11-26 | Discharge: 2019-11-26 | Disposition: A | Discharge status: Home / Self Care | Payer: 59 | Attending: Emergency Medicine | Admitting: Emergency Medicine

## 2019-11-26 ENCOUNTER — Other Ambulatory Visit: Payer: Self-pay

## 2019-11-26 ENCOUNTER — Encounter: Payer: Self-pay | Admitting: *Deleted

## 2019-11-26 DIAGNOSIS — R319 Hematuria, unspecified: Secondary | ICD-10-CM | POA: Diagnosis not present

## 2019-11-26 DIAGNOSIS — M545 Low back pain: Secondary | ICD-10-CM | POA: Diagnosis not present

## 2019-11-26 DIAGNOSIS — R1030 Lower abdominal pain, unspecified: Secondary | ICD-10-CM | POA: Diagnosis present

## 2019-11-26 DIAGNOSIS — R509 Fever, unspecified: Secondary | ICD-10-CM | POA: Diagnosis not present

## 2019-11-26 DIAGNOSIS — Z5321 Procedure and treatment not carried out due to patient leaving prior to being seen by health care provider: Secondary | ICD-10-CM | POA: Diagnosis not present

## 2019-11-26 DIAGNOSIS — R110 Nausea: Secondary | ICD-10-CM | POA: Diagnosis not present

## 2019-11-26 LAB — CBC
HCT: 36.2 % (ref 36.0–46.0)
Hemoglobin: 12.2 g/dL (ref 12.0–15.0)
MCH: 28.9 pg (ref 26.0–34.0)
MCHC: 33.7 g/dL (ref 30.0–36.0)
MCV: 85.8 fL (ref 80.0–100.0)
Platelets: 246 10*3/uL (ref 150–400)
RBC: 4.22 MIL/uL (ref 3.87–5.11)
RDW: 12.7 % (ref 11.5–15.5)
WBC: 6 10*3/uL (ref 4.0–10.5)
nRBC: 0 % (ref 0.0–0.2)

## 2019-11-26 LAB — COMPREHENSIVE METABOLIC PANEL
ALT: 20 U/L (ref 0–44)
AST: 24 U/L (ref 15–41)
Albumin: 4 g/dL (ref 3.5–5.0)
Alkaline Phosphatase: 54 U/L (ref 38–126)
Anion gap: 7 (ref 5–15)
BUN: 14 mg/dL (ref 6–20)
CO2: 25 mmol/L (ref 22–32)
Calcium: 9.2 mg/dL (ref 8.9–10.3)
Chloride: 105 mmol/L (ref 98–111)
Creatinine, Ser: 0.68 mg/dL (ref 0.44–1.00)
GFR calc Af Amer: >60 mL/min (ref >60)
GFR calc non Af Amer: >60 mL/min (ref >60)
Glucose, Bld: 150 mg/dL — ABNORMAL HIGH (ref 70–99)
Potassium: 3.7 mmol/L (ref 3.5–5.1)
Sodium: 137 mmol/L (ref 135–145)
Total Bilirubin: 0.4 mg/dL (ref 0.3–1.2)
Total Protein: 6.8 g/dL (ref 6.5–8.1)

## 2019-11-26 LAB — URINALYSIS, COMPLETE (UACMP) WITH MICROSCOPIC
Bilirubin Urine: NEGATIVE
Glucose, UA: NEGATIVE mg/dL
Hgb urine dipstick: NEGATIVE
Ketones, ur: NEGATIVE mg/dL
Nitrite: POSITIVE — AB
Protein, ur: NEGATIVE mg/dL
Specific Gravity, Urine: 1.002 — ABNORMAL LOW (ref 1.005–1.030)
pH: 6 (ref 5.0–8.0)

## 2019-11-26 LAB — LIPASE, BLOOD: Lipase: 31 U/L (ref 11–51)

## 2019-11-26 MED ORDER — SODIUM CHLORIDE 0.9% FLUSH: 3.0000 mL | Freq: Once | INTRAVENOUS | Status: DC

## 2019-11-26 NOTE — ED Triage Notes (Signed)
Lower abdominal pain, lower back pain and blood in urine since Tuesday. Seen at [facility] for the same, given pyridium and macrobid. Pain not improved, tmax 100.5, associated with nausea.

## 2020-04-29 ENCOUNTER — Encounter: Payer: Self-pay | Admitting: Emergency Medicine

## 2020-04-29 ENCOUNTER — Ambulatory Visit
Admission: EM | Admit: 2020-04-29 | Discharge: 2020-04-29 | Disposition: A | Discharge status: Home / Self Care | Payer: 59 | Attending: Family | Admitting: Family

## 2020-04-29 ENCOUNTER — Other Ambulatory Visit: Payer: Self-pay

## 2020-04-29 DIAGNOSIS — J069 Acute upper respiratory infection, unspecified: Secondary | ICD-10-CM | POA: Diagnosis present

## 2020-04-29 DIAGNOSIS — Z20822 Contact with and (suspected) exposure to covid-19: Secondary | ICD-10-CM | POA: Diagnosis not present

## 2020-04-29 MED ORDER — ACETAMINOPHEN 325 MG PO TABS: 650.0000 mg | ORAL_TABLET | Freq: Four times a day (QID) | ORAL | 0 refills | Status: DC | PRN

## 2020-04-29 MED ORDER — FLUTICASONE PROPIONATE 50 MCG/ACT NA SUSP: 1.0000 | Freq: Every day | NASAL | 2 refills | Status: DC

## 2020-04-29 MED ORDER — CEPACOL SORE THROAT 5.4 MG MT LOZG: 1.0000 | LOZENGE | OROMUCOSAL | 0 refills | Status: DC | PRN

## 2020-04-29 NOTE — Discharge Instructions (Signed)
Take medications as prescribed  Monitor your symptoms and if severe symptoms of high fever, shortness of breath or other concerning symptoms go to the emergency  Follow-up with your primary care as needed  If your Covid-19 test is positive, you will receive a phone call from [facility] regarding your results. Negative test results are not called. Both positive and negative results area always visible on MyChart. If you do not have a MyChart account, sign up instructions are in your discharge papers.   Persons who are directed to care for themselves at home may discontinue isolation under the following conditions:   At least 10 days have passed since symptom onset and  At least 24 hours have passed without running a fever (this means without the use of fever-reducing medications) and  Other symptoms have improved.  Persons infected with COVID-19 who never develop symptoms may discontinue isolation and other precautions 10 days after the date of their first positive COVID-19 test.

## 2020-04-29 NOTE — ED Provider Notes (Signed)
[place]    CSN: [id number] Arrival date & time: 04/29/20  0913      History   Chief Complaint Chief Complaint  Patient presents with  . Nasal Congestion    HPI Veronica Montes is a 54 y.o. female.   Patient reports for Covid testing.  She reports she has had nasal congestion, sneezing and slight sore throat.  Symptoms started a few days ago.  She took an at-home Covid test and this was positive.  Denies cough, shortness of breath, headache, fever, chills.  Denies change in taste or smell.  Denies nausea vomiting or diarrhea.  Received Covid vaccines.  Husband is here with similar symptoms been tested for Covid.  They were together and were there is a possible Covid exposure.     Past Medical History:  Diagnosis Date  . Anxiety   . Arthritis   . History of kidney stones 1991  . Melanoma ([place]) 2011   Resected from Right upper chest area.    Patient Active Problem List   Diagnosis Date Noted  . Status post laparoscopic supracervical hysterectomy 03/10/2018  . Anxiety 10/08/2016  . Melanoma ([place]) 10/08/2016    Past Surgical History:  Procedure Laterality Date  . ABDOMINAL HYSTERECTOMY  2002  . AUGMENTATION MAMMAPLASTY Bilateral 10+yrs ago  . BACK SURGERY    . BREAST BIOPSY Left 2014   bx/clip-neg  . COLONOSCOPY WITH PROPOFOL N/A 09/12/2016   Procedure: COLONOSCOPY WITH PROPOFOL;  Surgeon: Jonathon Bellows, MD;  [facility] ENDOSCOPY;  Service: Endoscopy;  Laterality: N/A;  . ESOPHAGOGASTRODUODENOSCOPY (EGD) WITH PROPOFOL N/A 09/12/2016   Procedure: ESOPHAGOGASTRODUODENOSCOPY (EGD) WITH PROPOFOL;  Surgeon: Jonathon Bellows, MD;  Location: [facility] ENDOSCOPY;  Service: Endoscopy;  Laterality: N/A;  . SHOULDER ARTHROSCOPY WITH OPEN ROTATOR CUFF REPAIR AND DISTAL CLAVICLE ACROMINECTOMY Left 08/15/2017   Procedure: SHOULDER ARTHROSCOPY WITH ROTATOR CUFF REPAIR AND SUBACROMINAL DECOMPRESSION;  Surgeon: Thornton Park, MD;  Location: [facility] ORS;  Service: Orthopedics;   Laterality: Left;  possible 23410 rotator cuff repair  . TUBAL LIGATION      OB History    Gravida  2   Para      Term      Preterm      AB      Living  1     SAB      TAB      Ectopic      Multiple      Live Births  1            Home Medications    Prior to Admission medications   Medication Sig Start Date End Date Taking? Authorizing Provider  LORazepam (ATIVAN) 1 MG tablet Take 1 to 2 tablets at bedtime as needed Patient taking differently: Take 1 mg by mouth 3 (three) times daily as needed for anxiety.  04/14/15  Yes Jerrol Banana., MD  traZODone (DESYREL) 150 MG tablet 150 mg at bedtime.  07/26/16  Yes [provider]  acetaminophen (TYLENOL) 325 MG tablet Take 2 tablets (650 mg total) by mouth every 6 (six) hours as needed. 04/29/20   Khristian Phillippi, Marguerita Beards, PA-C  celecoxib (CELEBREX) 200 MG capsule Take 200 mg by mouth 2 (two) times daily. 09/27/19   [provider]  famotidine (PEPCID) 20 MG tablet Take 1 tablet (20 mg total) by mouth 2 (two) times daily. Patient not taking: Reported on 05/18/2018 04/28/18   Carrie Mew, MD  fluticasone [facility]) 50 MCG/ACT nasal spray Place 1 spray into  both nostrils daily. 04/29/20   Hanah Moultry, Marguerita Beards, PA-C  ibuprofen (ADVIL,MOTRIN) 200 MG tablet Take 800 mg by mouth every 6 (six) hours as needed for headache or moderate pain.    [provider]  meloxicam (MOBIC) 7.5 MG tablet  01/20/18   [provider]  Menthol (CEPACOL SORE THROAT) 5.4 MG LOZG Use as directed 1 lozenge (5.4 mg total) in the mouth or throat every 2 (two) hours as needed. 04/29/20   Kilani Joffe, Marguerita Beards, PA-C  metoCLOPramide (REGLAN) 10 MG tablet Take 1 tablet (10 mg total) by mouth every 6 (six) hours as needed. Patient not taking: Reported on 05/18/2018 04/28/18   Carrie Mew, MD  sucralfate (CARAFATE) 1 g tablet Take 1 tablet (1 g total) by mouth 4 (four) times daily. Patient not taking: Reported on 10/19/2019 04/28/18   Carrie Mew, MD    Family History Family History  Problem Relation Age of Onset  . Heart disease Brother   . Breast cancer Neg Hx     Social History Social History   Tobacco Use  . Smoking status: Never Smoker  . Smokeless tobacco: Never Used  Vaping Use  . Vaping Use: Never used  Substance Use Topics  . Alcohol use: No  . Drug use: No     Allergies   Ibuprofen and Oxycodone   Review of Systems Review of Systems   Physical Exam Triage Vital Signs ED Triage Vitals  Enc Vitals Group     BP 04/29/20 0944 (!) 101/91     Pulse Rate 04/29/20 0944 77     Resp 04/29/20 0944 14     Temp 04/29/20 0944 98.3 F (36.8 C)     Temp Source 04/29/20 0944 Oral     SpO2 04/29/20 0944 99 %     Weight 04/29/20 0940 132 lb (59.9 kg)     Height 04/29/20 0940 5\' 3"  (1.6 m)     Head Circumference --      Peak Flow --      Pain Score 04/29/20 0940 0     Pain Loc --      Pain Edu? --      Excl. in [place]? --    No data found.  Updated Vital Signs BP (!) 101/91 (BP Location: Right Arm)   Pulse 77   Temp 98.3 F (36.8 C) (Oral)   Resp 14   Ht 5\' 3"  (1.6 m)   Wt 132 lb (59.9 kg)   SpO2 99%   BMI 23.38 kg/m   Visual Acuity Right Eye Distance:   Left Eye Distance:   Bilateral Distance:    Right Eye Near:   Left Eye Near:    Bilateral Near:     Physical Exam Vitals and nursing note reviewed.  Constitutional:      General: She is not in acute distress.    Appearance: She is well-developed. She is not ill-appearing.  HENT:     Head: Normocephalic and atraumatic.     Right Ear: Tympanic membrane normal.     Left Ear: Tympanic membrane normal.     Nose: Congestion present.     Mouth/Throat:     Mouth: Mucous membranes are moist.     Pharynx: No oropharyngeal exudate or posterior oropharyngeal erythema.  Eyes:     Conjunctiva/sclera: Conjunctivae normal.  Cardiovascular:     Rate and Rhythm: Normal rate and regular rhythm.     Heart sounds: No murmur heard.    Pulmonary:  Effort: Pulmonary effort is normal. No respiratory distress.     Breath sounds: Normal breath sounds. No wheezing, rhonchi or rales.  Abdominal:     Palpations: Abdomen is soft.     Tenderness: There is no abdominal tenderness.  Musculoskeletal:     Cervical back: Neck supple.  Skin:    General: Skin is warm and dry.  Neurological:     Mental Status: She is alert.      UC Treatments / Results  Labs (all labs ordered are listed, but only abnormal results are displayed) Labs Reviewed  SARS CORONAVIRUS 2 (TAT 6-24 HRS)    EKG   Radiology No results found.  Procedures Procedures (including critical care time)  Medications Ordered in UC Medications - No data to display  Initial Impression / Assessment and Plan / UC Course  I have reviewed the triage vital signs and the nursing notes.  Pertinent labs & imaging results that were available during my care of the patient were reviewed by me and considered in my medical decision making (see chart for details).     #Viral URI Patient 54 year old presenting with viral upper respiratory symptoms after having exposure to Covid.  Covid test sent.  Normal vital signs and afebrile with reassuring exam.  Symptomatic care discussed.  Return, follow-up emergency room precautions discussed.  Patient verbalized agreement understanding plan of care Final Clinical Impressions(s) / UC Diagnoses   Final diagnoses:  Viral upper respiratory tract infection  Encounter for laboratory testing for COVID-19 virus  Exposure to COVID-19 virus     Discharge Instructions     Take medications as prescribed  Monitor your symptoms and if severe symptoms of high fever, shortness of breath or other concerning symptoms go to the emergency  Follow-up with your primary care as needed  If your Covid-19 test is positive, you will receive a phone call from [facility] regarding your results. Negative test results are not called. Both  positive and negative results area always visible on MyChart. If you do not have a MyChart account, sign up instructions are in your discharge papers.   Persons who are directed to care for themselves at home may discontinue isolation under the following conditions:  . At least 10 days have passed since symptom onset and . At least 24 hours have passed without running a fever (this means without the use of fever-reducing medications) and . Other symptoms have improved.  Persons infected with COVID-19 who never develop symptoms may discontinue isolation and other precautions 10 days after the date of their first positive COVID-19 test.       ED Prescriptions    Medication Sig Dispense Auth. Provider   acetaminophen (TYLENOL) 325 MG tablet Take 2 tablets (650 mg total) by mouth every 6 (six) hours as needed. 30 tablet Keilana Morlock, Marguerita Beards, PA-C   fluticasone (FLONASE) 50 MCG/ACT nasal spray Place 1 spray into both nostrils daily. 15.8 mL Lendy Dittrich, Marguerita Beards, PA-C   Menthol (CEPACOL SORE THROAT) 5.4 MG LOZG Use as directed 1 lozenge (5.4 mg total) in the mouth or throat every 2 (two) hours as needed. 30 lozenge Lindyn Vossler, Marguerita Beards, PA-C     PDMP not reviewed this encounter.   Purnell Shoemaker, PA-C 04/29/20 1957

## 2020-04-29 NOTE — ED Triage Notes (Signed)
Patient c/o sneezing and runny nose that started on Tuesday.  Patient denies fevers.  Patient states that she an at home COVID test last night and it was positive.

## 2020-04-30 LAB — SARS CORONAVIRUS 2 (TAT 6-24 HRS): SARS Coronavirus 2: NEGATIVE

## 2020-06-14 ENCOUNTER — Emergency Department: Admission: EM | Admit: 2020-06-14 | Discharge: 2020-06-14 | Discharge status: Against Medical Advice | Payer: 59

## 2020-06-14 NOTE — ED Triage Notes (Signed)
Pt comes into the ED via EMS from work states she was going to sit on a rolling stool that came out from under her and fell landing on her buttocks, pt c/o buttock and lower back pain , lumbar surgery in the past 6-8 weeks and concerned for injury to recent surgery.

## 2020-09-19 ENCOUNTER — Other Ambulatory Visit: Payer: Self-pay | Admitting: Internal Medicine

## 2020-09-19 DIAGNOSIS — Z1231 Encounter for screening mammogram for malignant neoplasm of breast: Secondary | ICD-10-CM

## 2020-10-09 ENCOUNTER — Ambulatory Visit
Admission: RE | Admit: 2020-10-09 | Discharge: 2020-10-09 | Disposition: A | Discharge status: Home / Self Care | Payer: 59 | Source: Ambulatory Visit | Attending: Internal Medicine | Admitting: Internal Medicine

## 2020-10-09 ENCOUNTER — Other Ambulatory Visit: Payer: Self-pay

## 2020-10-09 DIAGNOSIS — Z1231 Encounter for screening mammogram for malignant neoplasm of breast: Secondary | ICD-10-CM

## 2020-10-18 ENCOUNTER — Encounter: Payer: 59 | Admitting: Certified Nurse Midwife

## 2021-09-11 ENCOUNTER — Other Ambulatory Visit: Payer: Self-pay | Admitting: Internal Medicine

## 2021-09-11 DIAGNOSIS — M546 Pain in thoracic spine: Secondary | ICD-10-CM | POA: Diagnosis not present

## 2021-09-11 DIAGNOSIS — S29019A Strain of muscle and tendon of unspecified wall of thorax, initial encounter: Secondary | ICD-10-CM | POA: Diagnosis not present

## 2021-09-11 DIAGNOSIS — Z1231 Encounter for screening mammogram for malignant neoplasm of breast: Secondary | ICD-10-CM

## 2021-10-17 ENCOUNTER — Other Ambulatory Visit: Payer: Self-pay

## 2021-10-17 ENCOUNTER — Ambulatory Visit
Admission: RE | Admit: 2021-10-17 | Discharge: 2021-10-17 | Disposition: A | Discharge status: Home / Self Care | Payer: 59 | Source: Ambulatory Visit | Attending: Internal Medicine | Admitting: Internal Medicine

## 2021-10-17 DIAGNOSIS — Z1231 Encounter for screening mammogram for malignant neoplasm of breast: Secondary | ICD-10-CM | POA: Insufficient documentation

## 2021-12-03 DIAGNOSIS — R69 Illness, unspecified: Secondary | ICD-10-CM | POA: Diagnosis not present

## 2021-12-03 DIAGNOSIS — Z79899 Other long term (current) drug therapy: Secondary | ICD-10-CM | POA: Diagnosis not present

## 2021-12-03 DIAGNOSIS — E782 Mixed hyperlipidemia: Secondary | ICD-10-CM | POA: Diagnosis not present

## 2021-12-17 DIAGNOSIS — R1011 Right upper quadrant pain: Secondary | ICD-10-CM | POA: Diagnosis not present

## 2021-12-17 DIAGNOSIS — Z20822 Contact with and (suspected) exposure to covid-19: Secondary | ICD-10-CM | POA: Diagnosis not present

## 2021-12-17 DIAGNOSIS — R109 Unspecified abdominal pain: Secondary | ICD-10-CM | POA: Diagnosis not present

## 2021-12-17 DIAGNOSIS — Z6822 Body mass index (BMI) 22.0-22.9, adult: Secondary | ICD-10-CM | POA: Diagnosis not present

## 2021-12-19 DIAGNOSIS — R1011 Right upper quadrant pain: Secondary | ICD-10-CM | POA: Diagnosis not present

## 2021-12-19 DIAGNOSIS — R1013 Epigastric pain: Secondary | ICD-10-CM | POA: Diagnosis not present

## 2022-05-06 DIAGNOSIS — N898 Other specified noninflammatory disorders of vagina: Secondary | ICD-10-CM | POA: Diagnosis not present

## 2022-05-06 DIAGNOSIS — Z1331 Encounter for screening for depression: Secondary | ICD-10-CM | POA: Diagnosis not present

## 2022-05-06 DIAGNOSIS — Z01419 Encounter for gynecological examination (general) (routine) without abnormal findings: Secondary | ICD-10-CM | POA: Diagnosis not present

## 2022-05-06 DIAGNOSIS — N3281 Overactive bladder: Secondary | ICD-10-CM | POA: Diagnosis not present

## 2022-06-04 DIAGNOSIS — Z1231 Encounter for screening mammogram for malignant neoplasm of breast: Secondary | ICD-10-CM | POA: Diagnosis not present

## 2022-06-04 DIAGNOSIS — F419 Anxiety disorder, unspecified: Secondary | ICD-10-CM | POA: Diagnosis not present

## 2022-06-04 DIAGNOSIS — E782 Mixed hyperlipidemia: Secondary | ICD-10-CM | POA: Diagnosis not present

## 2022-06-04 DIAGNOSIS — Z0000 Encounter for general adult medical examination without abnormal findings: Secondary | ICD-10-CM | POA: Diagnosis not present

## 2022-06-04 DIAGNOSIS — Z79899 Other long term (current) drug therapy: Secondary | ICD-10-CM | POA: Diagnosis not present

## 2022-06-05 ENCOUNTER — Other Ambulatory Visit: Payer: Self-pay | Admitting: Internal Medicine

## 2022-06-05 DIAGNOSIS — Z1231 Encounter for screening mammogram for malignant neoplasm of breast: Secondary | ICD-10-CM

## 2022-07-03 DIAGNOSIS — H43811 Vitreous degeneration, right eye: Secondary | ICD-10-CM | POA: Diagnosis not present

## 2022-10-21 ENCOUNTER — Ambulatory Visit
Admission: RE | Admit: 2022-10-21 | Discharge: 2022-10-21 | Disposition: A | Discharge status: Home / Self Care | Payer: 59 | Source: Ambulatory Visit | Attending: Internal Medicine | Admitting: Internal Medicine

## 2022-10-21 DIAGNOSIS — Z1231 Encounter for screening mammogram for malignant neoplasm of breast: Secondary | ICD-10-CM | POA: Diagnosis not present

## 2022-11-06 DIAGNOSIS — F419 Anxiety disorder, unspecified: Secondary | ICD-10-CM | POA: Diagnosis not present

## 2022-11-12 DIAGNOSIS — Z85820 Personal history of malignant melanoma of skin: Secondary | ICD-10-CM | POA: Diagnosis not present

## 2022-11-12 DIAGNOSIS — L821 Other seborrheic keratosis: Secondary | ICD-10-CM | POA: Diagnosis not present

## 2022-11-12 DIAGNOSIS — D2261 Melanocytic nevi of right upper limb, including shoulder: Secondary | ICD-10-CM | POA: Diagnosis not present

## 2022-11-12 DIAGNOSIS — D485 Neoplasm of uncertain behavior of skin: Secondary | ICD-10-CM | POA: Diagnosis not present

## 2022-11-12 DIAGNOSIS — Z85828 Personal history of other malignant neoplasm of skin: Secondary | ICD-10-CM | POA: Diagnosis not present

## 2022-11-12 DIAGNOSIS — L814 Other melanin hyperpigmentation: Secondary | ICD-10-CM | POA: Diagnosis not present

## 2022-11-12 DIAGNOSIS — D2262 Melanocytic nevi of left upper limb, including shoulder: Secondary | ICD-10-CM | POA: Diagnosis not present

## 2022-11-12 DIAGNOSIS — X32XXXA Exposure to sunlight, initial encounter: Secondary | ICD-10-CM | POA: Diagnosis not present

## 2022-11-12 DIAGNOSIS — D225 Melanocytic nevi of trunk: Secondary | ICD-10-CM | POA: Diagnosis not present

## 2022-11-12 DIAGNOSIS — D2271 Melanocytic nevi of right lower limb, including hip: Secondary | ICD-10-CM | POA: Diagnosis not present

## 2022-11-12 DIAGNOSIS — D2272 Melanocytic nevi of left lower limb, including hip: Secondary | ICD-10-CM | POA: Diagnosis not present

## 2022-11-12 DIAGNOSIS — Z08 Encounter for follow-up examination after completed treatment for malignant neoplasm: Secondary | ICD-10-CM | POA: Diagnosis not present

## 2022-11-22 ENCOUNTER — Telehealth: Payer: 59 | Admitting: Family Medicine

## 2022-11-22 DIAGNOSIS — N3000 Acute cystitis without hematuria: Secondary | ICD-10-CM

## 2022-11-22 MED ORDER — SULFAMETHOXAZOLE-TRIMETHOPRIM 800-160 MG PO TABS: 1.0000 | ORAL_TABLET | Freq: Two times a day (BID) | ORAL | 0 refills | Status: AC

## 2022-11-22 NOTE — Progress Notes (Signed)
Virtual Visit Consent   Veronica Montes, you are scheduled for a virtual visit with a [facility] provider today. Just as with appointments in the office, your consent must be obtained to participate. Your consent will be active for this visit and any virtual visit you may have with one of our providers in the next 365 days. If you have a MyChart account, a copy of this consent can be sent to you electronically.  As this is a virtual visit, video technology does not allow for your provider to perform a traditional examination. This may limit your provider's ability to fully assess your condition. If your provider identifies any concerns that need to be evaluated in person or the need to arrange testing (such as labs, EKG, etc.), we will make arrangements to do so. Although advances in technology are sophisticated, we cannot ensure that it will always work on either your end or our end. If the connection with a video visit is poor, the visit may have to be switched to a telephone visit. With either a video or telephone visit, we are not always able to ensure that we have a secure connection.  By engaging in this virtual visit, you consent to the provision of healthcare and authorize for your insurance to be billed (if applicable) for the services provided during this visit. Depending on your insurance coverage, you may receive a charge related to this service.  I need to obtain your verbal consent now. Are you willing to proceed with your visit today? Veronica Montes has provided verbal consent on 57/12/2022 for a virtual visit (video or telephone). Georgana Curio, FNP  Date: 11/22/2022 7:22 PM  Virtual Visit via Video Note   I, Georgana Curio, connected with  Veronica Montes  ([id number], [birth year]) on 11/22/22 at  7:30 PM EDT by a video-enabled telemedicine application and verified that I am speaking with the correct person using two identifiers.  Location: Patient: Virtual Visit Location Patient:  Home Provider: Virtual Visit Location Provider: Home Office   I discussed the limitations of evaluation and management by telemedicine and the availability of in person appointments. The patient expressed understanding and agreed to proceed.    History of Present Illness: Veronica Montes is a 57 y.o. who identifies as a female who was assigned female at birth, and is being seen today for burning and frequency with urination. Pressure over bladder. No fever. Sx for 2 days. Marland Kitchen  HPI: HPI  Problems:  Patient Active Problem List   Diagnosis Date Noted   Status post laparoscopic supracervical hysterectomy 03/10/2018   Anxiety 10/08/2016   Melanoma 10/08/2016    Allergies:  Allergies  Allergen Reactions   Ibuprofen Swelling    Face swelling    Oxycodone Itching   Medications:  Current Outpatient Medications:    acetaminophen (TYLENOL) 325 MG tablet, Take 2 tablets (650 mg total) by mouth every 6 (six) hours as needed., Disp: 30 tablet, Rfl: 0   celecoxib (CELEBREX) 200 MG capsule, Take 200 mg by mouth 2 (two) times daily., Disp: , Rfl:    famotidine (PEPCID) 20 MG tablet, Take 1 tablet (20 mg total) by mouth 2 (two) times daily. (Patient not taking: Reported on 05/18/2018), Disp: 60 tablet, Rfl: 0   fluticasone (FLONASE) 50 MCG/ACT nasal spray, Place 1 spray into both nostrils daily., Disp: 15.8 mL, Rfl: 2   ibuprofen (ADVIL,MOTRIN) 200 MG tablet, Take 800 mg by mouth every 6 (six) hours as needed for headache or moderate pain., Disp: ,  Rfl:    LORazepam (ATIVAN) 1 MG tablet, Take 1 to 2 tablets at bedtime as needed (Patient taking differently: Take 1 mg by mouth 3 (three) times daily as needed for anxiety. ), Disp: 60 tablet, Rfl: 0   meloxicam (MOBIC) 7.5 MG tablet, , Disp: , Rfl: 0   Menthol (CEPACOL SORE THROAT) 5.4 MG LOZG, Use as directed 1 lozenge (5.4 mg total) in the mouth or throat every 2 (two) hours as needed., Disp: 30 lozenge, Rfl: 0   metoCLOPramide (REGLAN) 10 MG tablet, Take  1 tablet (10 mg total) by mouth every 6 (six) hours as needed. (Patient not taking: Reported on 05/18/2018), Disp: 30 tablet, Rfl: 0   sucralfate (CARAFATE) 1 g tablet, Take 1 tablet (1 g total) by mouth 4 (four) times daily. (Patient not taking: Reported on 10/19/2019), Disp: 120 tablet, Rfl: 1   traZODone (DESYREL) 150 MG tablet, 150 mg at bedtime. , Disp: , Rfl: 6  Observations/Objective: Patient is well-developed, well-nourished in no acute distress.  Resting comfortably at home.  Head is normocephalic, atraumatic.  No labored breathing.  Speech is clear and coherent with logical content.  Patient is alert and oriented at baseline.    Assessment and Plan: 1. Acute cystitis without hematuria  Increase fluids, preventative measures discussed, UC if sx worsen.   Follow Up Instructions: I discussed the assessment and treatment plan with the patient. The patient was provided an opportunity to ask questions and all were answered. The patient agreed with the plan and demonstrated an understanding of the instructions.  A copy of instructions were sent to the patient via MyChart unless otherwise noted below.     The patient was advised to call back or seek an in-person evaluation if the symptoms worsen or if the condition fails to improve as anticipated.  Time:  I spent 10 minutes with the patient via telehealth technology discussing the above problems/concerns.    Georgana Curioiane Ishitha Roper, FNP

## 2022-11-22 NOTE — Patient Instructions (Signed)
Urinary Tract Infection, Adult  A urinary tract infection (UTI) is an infection of any part of the urinary tract. The urinary tract includes the kidneys, ureters, bladder, and urethra. These organs make, store, and get rid of urine in the body. An upper UTI affects the ureters and kidneys. A lower UTI affects the bladder and urethra. What are the causes? Most urinary tract infections are caused by bacteria in your genital area around your urethra, where urine leaves your body. These bacteria grow and cause inflammation of your urinary tract. What increases the risk? You are more likely to develop this condition if: You have a urinary catheter that stays in place. You are not able to control when you urinate or have a bowel movement (incontinence). You are female and you: Use a spermicide or diaphragm for birth control. Have low estrogen levels. Are pregnant. You have certain genes that increase your risk. You are sexually active. You take antibiotic medicines. You have a condition that causes your flow of urine to slow down, such as: An enlarged prostate, if you are female. Blockage in your urethra. A kidney stone. A nerve condition that affects your bladder control (neurogenic bladder). Not getting enough to drink, or not urinating often. You have certain medical conditions, such as: Diabetes. A weak disease-fighting system (immunesystem). Sickle cell disease. Gout. Spinal cord injury. What are the signs or symptoms? Symptoms of this condition include: Needing to urinate right away (urgency). Frequent urination. This may include small amounts of urine each time you urinate. Pain or burning with urination. Blood in the urine. Urine that smells bad or unusual. Trouble urinating. Cloudy urine. Vaginal discharge, if you are female. Pain in the abdomen or the lower back. You may also have: Vomiting or a decreased appetite. Confusion. Irritability or tiredness. A fever or  chills. Diarrhea. The first symptom in older adults may be confusion. In some cases, they may not have any symptoms until the infection has worsened. How is this diagnosed? This condition is diagnosed based on your medical history and a physical exam. You may also have other tests, including: Urine tests. Blood tests. Tests for STIs (sexually transmitted infections). If you have had more than one UTI, a cystoscopy or imaging studies may be done to determine the cause of the infections. How is this treated? Treatment for this condition includes: Antibiotic medicine. Over-the-counter medicines to treat discomfort. Drinking enough water to stay hydrated. If you have frequent infections or have other conditions such as a kidney stone, you may need to see a health care provider who specializes in the urinary tract (urologist). In rare cases, urinary tract infections can cause sepsis. Sepsis is a life-threatening condition that occurs when the body responds to an infection. Sepsis is treated in the hospital with IV antibiotics, fluids, and other medicines. Follow these instructions at home:  Medicines Take over-the-counter and prescription medicines only as told by your health care provider. If you were prescribed an antibiotic medicine, take it as told by your health care provider. Do not stop using the antibiotic even if you start to feel better. General instructions Make sure you: Empty your bladder often and completely. Do not hold urine for long periods of time. Empty your bladder after sex. Wipe from front to back after urinating or having a bowel movement if you are female. Use each tissue only one time when you wipe. Drink enough fluid to keep your urine pale yellow. Keep all follow-up visits. This is important. Contact a health   care provider if: Your symptoms do not get better after 1-2 days. Your symptoms go away and then return. Get help right away if: You have severe pain in  your back or your lower abdomen. You have a fever or chills. You have nausea or vomiting. Summary A urinary tract infection (UTI) is an infection of any part of the urinary tract, which includes the kidneys, ureters, bladder, and urethra. Most urinary tract infections are caused by bacteria in your genital area. Treatment for this condition often includes antibiotic medicines. If you were prescribed an antibiotic medicine, take it as told by your health care provider. Do not stop using the antibiotic even if you start to feel better. Keep all follow-up visits. This is important. This information is not intended to replace advice given to you by your health care provider. Make sure you discuss any questions you have with your health care provider. Document Revised: 03/17/2020 Document Reviewed: 03/17/2020 Elsevier Patient Education  2023 [employer]  

## 2022-11-25 DIAGNOSIS — Z79899 Other long term (current) drug therapy: Secondary | ICD-10-CM | POA: Diagnosis not present

## 2022-11-25 DIAGNOSIS — E782 Mixed hyperlipidemia: Secondary | ICD-10-CM | POA: Diagnosis not present

## 2022-12-04 DIAGNOSIS — F419 Anxiety disorder, unspecified: Secondary | ICD-10-CM | POA: Diagnosis not present

## 2022-12-04 DIAGNOSIS — E782 Mixed hyperlipidemia: Secondary | ICD-10-CM | POA: Diagnosis not present

## 2023-01-14 DIAGNOSIS — F419 Anxiety disorder, unspecified: Secondary | ICD-10-CM | POA: Diagnosis not present

## 2023-06-06 DIAGNOSIS — M4726 Other spondylosis with radiculopathy, lumbar region: Secondary | ICD-10-CM | POA: Diagnosis not present

## 2023-06-16 DIAGNOSIS — F419 Anxiety disorder, unspecified: Secondary | ICD-10-CM | POA: Diagnosis not present

## 2023-06-16 DIAGNOSIS — E782 Mixed hyperlipidemia: Secondary | ICD-10-CM | POA: Diagnosis not present

## 2023-06-16 DIAGNOSIS — Z1231 Encounter for screening mammogram for malignant neoplasm of breast: Secondary | ICD-10-CM | POA: Diagnosis not present

## 2023-06-16 DIAGNOSIS — M4726 Other spondylosis with radiculopathy, lumbar region: Secondary | ICD-10-CM | POA: Diagnosis not present

## 2023-06-16 DIAGNOSIS — Z79899 Other long term (current) drug therapy: Secondary | ICD-10-CM | POA: Diagnosis not present

## 2023-06-16 DIAGNOSIS — M5432 Sciatica, left side: Secondary | ICD-10-CM | POA: Diagnosis not present

## 2023-06-16 DIAGNOSIS — Z0000 Encounter for general adult medical examination without abnormal findings: Secondary | ICD-10-CM | POA: Diagnosis not present

## 2023-06-16 DIAGNOSIS — Z13820 Encounter for screening for osteoporosis: Secondary | ICD-10-CM | POA: Diagnosis not present

## 2023-06-16 DIAGNOSIS — Z01419 Encounter for gynecological examination (general) (routine) without abnormal findings: Secondary | ICD-10-CM | POA: Diagnosis not present

## 2023-06-17 ENCOUNTER — Other Ambulatory Visit: Payer: Self-pay | Admitting: Internal Medicine

## 2023-06-17 DIAGNOSIS — Z1231 Encounter for screening mammogram for malignant neoplasm of breast: Secondary | ICD-10-CM

## 2023-06-26 DIAGNOSIS — M5432 Sciatica, left side: Secondary | ICD-10-CM | POA: Diagnosis not present

## 2023-07-04 DIAGNOSIS — M4726 Other spondylosis with radiculopathy, lumbar region: Secondary | ICD-10-CM | POA: Diagnosis not present

## 2023-07-04 DIAGNOSIS — M4316 Spondylolisthesis, lumbar region: Secondary | ICD-10-CM | POA: Diagnosis not present

## 2023-07-11 DIAGNOSIS — M47816 Spondylosis without myelopathy or radiculopathy, lumbar region: Secondary | ICD-10-CM | POA: Diagnosis not present

## 2023-07-11 DIAGNOSIS — M4316 Spondylolisthesis, lumbar region: Secondary | ICD-10-CM | POA: Diagnosis not present

## 2023-07-21 DIAGNOSIS — M4726 Other spondylosis with radiculopathy, lumbar region: Secondary | ICD-10-CM | POA: Diagnosis not present

## 2023-07-21 DIAGNOSIS — M4316 Spondylolisthesis, lumbar region: Secondary | ICD-10-CM | POA: Diagnosis not present

## 2023-07-24 DIAGNOSIS — M4316 Spondylolisthesis, lumbar region: Secondary | ICD-10-CM | POA: Diagnosis not present

## 2023-07-24 DIAGNOSIS — M4726 Other spondylosis with radiculopathy, lumbar region: Secondary | ICD-10-CM | POA: Diagnosis not present

## 2023-07-24 DIAGNOSIS — E782 Mixed hyperlipidemia: Secondary | ICD-10-CM | POA: Diagnosis not present

## 2023-07-24 DIAGNOSIS — Z79899 Other long term (current) drug therapy: Secondary | ICD-10-CM | POA: Diagnosis not present

## 2023-08-04 DIAGNOSIS — M4316 Spondylolisthesis, lumbar region: Secondary | ICD-10-CM | POA: Diagnosis not present

## 2023-08-04 DIAGNOSIS — M4726 Other spondylosis with radiculopathy, lumbar region: Secondary | ICD-10-CM | POA: Diagnosis not present

## 2023-08-08 DIAGNOSIS — M4726 Other spondylosis with radiculopathy, lumbar region: Secondary | ICD-10-CM | POA: Diagnosis not present

## 2023-08-08 DIAGNOSIS — M4316 Spondylolisthesis, lumbar region: Secondary | ICD-10-CM | POA: Diagnosis not present

## 2023-08-14 DIAGNOSIS — M4726 Other spondylosis with radiculopathy, lumbar region: Secondary | ICD-10-CM | POA: Diagnosis not present

## 2023-08-14 DIAGNOSIS — M4316 Spondylolisthesis, lumbar region: Secondary | ICD-10-CM | POA: Diagnosis not present

## 2023-08-18 DIAGNOSIS — M4726 Other spondylosis with radiculopathy, lumbar region: Secondary | ICD-10-CM | POA: Diagnosis not present

## 2023-08-18 DIAGNOSIS — M4316 Spondylolisthesis, lumbar region: Secondary | ICD-10-CM | POA: Diagnosis not present

## 2023-08-22 DIAGNOSIS — M4316 Spondylolisthesis, lumbar region: Secondary | ICD-10-CM | POA: Diagnosis not present

## 2023-08-22 DIAGNOSIS — M4726 Other spondylosis with radiculopathy, lumbar region: Secondary | ICD-10-CM | POA: Diagnosis not present

## 2023-08-25 DIAGNOSIS — M4726 Other spondylosis with radiculopathy, lumbar region: Secondary | ICD-10-CM | POA: Diagnosis not present

## 2023-08-25 DIAGNOSIS — M4316 Spondylolisthesis, lumbar region: Secondary | ICD-10-CM | POA: Diagnosis not present

## 2023-08-29 DIAGNOSIS — M4726 Other spondylosis with radiculopathy, lumbar region: Secondary | ICD-10-CM | POA: Diagnosis not present

## 2023-08-29 DIAGNOSIS — M4316 Spondylolisthesis, lumbar region: Secondary | ICD-10-CM | POA: Diagnosis not present

## 2023-09-01 DIAGNOSIS — M4316 Spondylolisthesis, lumbar region: Secondary | ICD-10-CM | POA: Diagnosis not present

## 2023-09-01 DIAGNOSIS — M4726 Other spondylosis with radiculopathy, lumbar region: Secondary | ICD-10-CM | POA: Diagnosis not present

## 2023-09-12 ENCOUNTER — Other Ambulatory Visit: Payer: Self-pay | Admitting: Neurosurgery

## 2023-10-10 NOTE — Pre-Procedure Instructions (Signed)
 Surgical Instructions   Your procedure is scheduled on Monday, March 3rd. Report to [facility] Main Entrance "A" at 10:20 A.M., then check in with the Admitting office. Any questions or running late day of surgery: call [phone]  Questions prior to your surgery date: call [phone], Monday-Friday, 8am-4pm. If you experience any cold or flu symptoms such as cough, fever, chills, shortness of breath, etc. between now and your scheduled surgery, please notify us at the above number.     Remember:  Do not eat or drink after midnight the night before your surgery    Take these medicines the morning of surgery with A SIP OF WATER  fluticasone [facility])    May take these medicines IF NEEDED: acetaminophen (TYLENOL)  LORazepam (ATIVAN)    One week prior to surgery, STOP taking any Aspirin (unless otherwise instructed by your surgeon) Aleve, Naproxen, Ibuprofen, Motrin, Advil, Goody's, BC's, all herbal medications, fish oil, and non-prescription vitamins. This includes celecoxib (CELEBREX)                      Do NOT Smoke (Tobacco/Vaping) for 24 hours prior to your procedure.  If you use a CPAP at night, you may bring your mask/headgear for your overnight stay.   You will be asked to remove any contacts, glasses, piercing's, hearing aid's, dentures/partials prior to surgery. Please bring cases for these items if needed.    Patients discharged the day of surgery will not be allowed to drive home, and someone needs to stay with them for 24 hours.  SURGICAL WAITING ROOM VISITATION Patients may have no more than 2 support people in the waiting area - these visitors may rotate.   Pre-op nurse will coordinate an appropriate time for 1 ADULT support person, who may not rotate, to accompany patient in pre-op.  Children under the age of 7 must have an adult with them who is not the patient and must remain in the main waiting area with an adult.  If the patient needs to stay at the  hospital during part of their recovery, the visitor guidelines for inpatient rooms apply.  Please refer to the [facility] website for the visitor guidelines for any additional information.   If you received a COVID test during your pre-op visit  it is requested that you wear a mask when out in public, stay away from anyone that may not be feeling well and notify your surgeon if you develop symptoms. If you have been in contact with anyone that has tested positive in the last 10 days please notify you surgeon.      Pre-operative 5 CHG Bathing Instructions   You can play a key role in reducing the risk of infection after surgery. Your skin needs to be as free of germs as possible. You can reduce the number of germs on your skin by washing with CHG (chlorhexidine gluconate) soap before surgery. CHG is an antiseptic soap that kills germs and continues to kill germs even after washing.   DO NOT use if you have an allergy to chlorhexidine/CHG or antibacterial soaps. If your skin becomes reddened or irritated, stop using the CHG and notify one of our RNs at [phone].   Please shower with the CHG soap starting 4 days before surgery using the following schedule:     Please keep in mind the following:  DO NOT shave, including legs and underarms, starting the day of your first shower.   You may shave your  face at any point before/day of surgery.  Place clean sheets on your bed the day you start using CHG soap. Use a clean washcloth (not used since being washed) for each shower. DO NOT sleep with pets once you start using the CHG.   CHG Shower Instructions:  Wash your face and private area with normal soap. If you choose to wash your hair, wash first with your normal shampoo.  After you use shampoo/soap, rinse your hair and body thoroughly to remove shampoo/soap residue.  Turn the water OFF and apply about 3 tablespoons (45 ml) of CHG soap to a CLEAN washcloth.  Apply CHG soap ONLY FROM YOUR  NECK DOWN TO YOUR TOES (washing for 3-5 minutes)  DO NOT use CHG soap on face, private areas, open wounds, or sores.  Pay special attention to the area where your surgery is being performed.  If you are having back surgery, having someone wash your back for you may be helpful. Wait 2 minutes after CHG soap is applied, then you may rinse off the CHG soap.  Pat dry with a clean towel  Put on clean clothes/pajamas   If you choose to wear lotion, please use ONLY the CHG-compatible lotions that are listed below.  Additional instructions for the day of surgery: DO NOT APPLY any lotions, deodorants, cologne, or perfumes.   Do not bring valuables to the hospital. [facility] is not responsible for any belongings/valuables. Do not wear nail polish, gel polish, artificial nails, or any other type of covering on natural nails (fingers and toes) Do not wear jewelry or makeup Put on clean/comfortable clothes.  Please brush your teeth.  Ask your nurse before applying any prescription medications to the skin.     CHG Compatible Lotions   Aveeno Moisturizing lotion  Cetaphil Moisturizing Cream  Cetaphil Moisturizing Lotion  Clairol Herbal Essence Moisturizing Lotion, Dry Skin  Clairol Herbal Essence Moisturizing Lotion, Extra Dry Skin  Clairol Herbal Essence Moisturizing Lotion, Normal Skin  Curel Age Defying Therapeutic Moisturizing Lotion with Alpha Hydroxy  Curel Extreme Care Body Lotion  Curel Soothing Hands Moisturizing Hand Lotion  Curel Therapeutic Moisturizing Cream, Fragrance-Free  Curel Therapeutic Moisturizing Lotion, Fragrance-Free  Curel Therapeutic Moisturizing Lotion, Original Formula  Eucerin Daily Replenishing Lotion  Eucerin Dry Skin Therapy Plus Alpha Hydroxy Crme  Eucerin Dry Skin Therapy Plus Alpha Hydroxy Lotion  Eucerin Original Crme  Eucerin Original Lotion  Eucerin Plus Crme Eucerin Plus Lotion  Eucerin TriLipid Replenishing Lotion  Keri Anti-Bacterial Hand  Lotion  Keri Deep Conditioning Original Lotion Dry Skin Formula Softly Scented  Keri Deep Conditioning Original Lotion, Fragrance Free Sensitive Skin Formula  Keri Lotion Fast Absorbing Fragrance Free Sensitive Skin Formula  Keri Lotion Fast Absorbing Softly Scented Dry Skin Formula  Keri Original Lotion  Keri Skin Renewal Lotion Keri Silky Smooth Lotion  Keri Silky Smooth Sensitive Skin Lotion  Nivea Body Creamy Conditioning Oil  Nivea Body Extra Enriched Lotion  Nivea Body Original Lotion  Nivea Body Sheer Moisturizing Lotion Nivea Crme  Nivea Skin Firming Lotion  NutraDerm 30 Skin Lotion  NutraDerm Skin Lotion  NutraDerm Therapeutic Skin Cream  NutraDerm Therapeutic Skin Lotion  ProShield Protective Hand Cream  Provon moisturizing lotion  Please read over the following fact sheets that you were given.

## 2023-10-13 ENCOUNTER — Encounter (HOSPITAL_COMMUNITY)
Admission: RE | Admit: 2023-10-13 | Discharge: 2023-10-13 | Disposition: A | Discharge status: Home / Self Care | Payer: 59 | Source: Ambulatory Visit | Attending: Neurosurgery | Admitting: Neurosurgery

## 2023-10-13 ENCOUNTER — Encounter (HOSPITAL_COMMUNITY): Payer: Self-pay

## 2023-10-13 ENCOUNTER — Other Ambulatory Visit: Payer: Self-pay

## 2023-10-13 VITALS — BP 127/81 | HR 68 | Temp 97.9°F | Resp 17 | Ht 63.0 in | Wt 136.4 lb

## 2023-10-13 DIAGNOSIS — Z01812 Encounter for preprocedural laboratory examination: Secondary | ICD-10-CM | POA: Insufficient documentation

## 2023-10-13 DIAGNOSIS — Z01818 Encounter for other preprocedural examination: Secondary | ICD-10-CM

## 2023-10-13 LAB — BASIC METABOLIC PANEL
Anion gap: 5 (ref 5–15)
BUN: 20 mg/dL (ref 6–20)
CO2: 28 mmol/L (ref 22–32)
Calcium: 9.3 mg/dL (ref 8.9–10.3)
Chloride: 104 mmol/L (ref 98–111)
Creatinine, Ser: 1.01 mg/dL — ABNORMAL HIGH (ref 0.44–1.00)
GFR, Estimated: >60 mL/min (ref >60)
Glucose, Bld: 88 mg/dL (ref 70–99)
Potassium: 3.8 mmol/L (ref 3.5–5.1)
Sodium: 137 mmol/L (ref 135–145)

## 2023-10-13 LAB — CBC
HCT: 38.7 % (ref 36.0–46.0)
Hemoglobin: 12.9 g/dL (ref 12.0–15.0)
MCH: 28.9 pg (ref 26.0–34.0)
MCHC: 33.3 g/dL (ref 30.0–36.0)
MCV: 86.6 fL (ref 80.0–100.0)
Platelets: 283 10*3/uL (ref 150–400)
RBC: 4.47 MIL/uL (ref 3.87–5.11)
RDW: 12.5 % (ref 11.5–15.5)
WBC: 7.3 10*3/uL (ref 4.0–10.5)
nRBC: 0 % (ref 0.0–0.2)

## 2023-10-13 LAB — TYPE AND SCREEN
ABO/RH(D): A POS
Antibody Screen: NEGATIVE

## 2023-10-13 LAB — SURGICAL PCR SCREEN
MRSA, PCR: NEGATIVE
Staphylococcus aureus: NEGATIVE

## 2023-10-13 NOTE — Progress Notes (Signed)
 PCP - Milton Ferguson Cardiologist - denies  PPM/ICD - denies Device Orders -  Rep Notified -   Chest x-ray - na EKG - na Stress Test - 10/26/09 ECHO - 12/18/2015 Cardiac Cath - denies  Sleep Study - denies CPAP - no  Fasting Blood Sugar - na Checks Blood Sugar _____ times a day  Last dose of GLP1 agonist-  na GLP1 instructions:   Blood Thinner Instructions:na Aspirin Instructions:na  ERAS Protcol -no PRE-SURGERY Ensure or G2-   COVID TEST- na   Anesthesia review: no  Patient denies shortness of breath, fever, cough and chest pain at PAT appointment   All instructions explained to the patient, with a verbal understanding of the material. Patient agrees to go over the instructions while at home for a better understanding. Patient also instructed to wear a mask when out in public prior to surgery. The opportunity to ask questions was provided.

## 2023-10-13 NOTE — Pre-Procedure Instructions (Signed)
 Surgical Instructions   Your procedure is scheduled on Monday, March 3rd. Report to [facility] Main Entrance "A" at 10:20 A.M., then check in with the Admitting office. Any questions or running late day of surgery: call [phone]  Questions prior to your surgery date: call [phone], Monday-Friday, 8am-4pm. If you experience any cold or flu symptoms such as cough, fever, chills, shortness of breath, etc. between now and your scheduled surgery, please notify us at the above number.     Remember:  Do not eat or drink after midnight the night before your surgery    Take these medicines the morning of surgery with A SIP OF WATER  desvenlafaxine (PRISTIQ)  estradiol (ESTRACE)  omeprazole (PRILOSEC)   May take these medicines IF NEEDED: acetaminophen (TYLENOL)     One week prior to surgery, STOP taking any Aspirin (unless otherwise instructed by your surgeon) Aleve, Naproxen, Ibuprofen, Motrin, Advil, Goody's, BC's, all herbal medications, fish oil, and non-prescription vitamins. This includes meloxicam (MOBIC) .                     Do NOT Smoke (Tobacco/Vaping) for 24 hours prior to your procedure.  If you use a CPAP at night, you may bring your mask/headgear for your overnight stay.   You will be asked to remove any contacts, glasses, piercing's, hearing aid's, dentures/partials prior to surgery. Please bring cases for these items if needed.    Patients discharged the day of surgery will not be allowed to drive home, and someone needs to stay with them for 24 hours.  SURGICAL WAITING ROOM VISITATION Patients may have no more than 2 support people in the waiting area - these visitors may rotate.   Pre-op nurse will coordinate an appropriate time for 1 ADULT support person, who may not rotate, to accompany patient in pre-op.  Children under the age of 50 must have an adult with them who is not the patient and must remain in the main waiting area with an adult.  If the patient  needs to stay at the hospital during part of their recovery, the visitor guidelines for inpatient rooms apply.  Please refer to the [facility] website for the visitor guidelines for any additional information.   If you received a COVID test during your pre-op visit  it is requested that you wear a mask when out in public, stay away from anyone that may not be feeling well and notify your surgeon if you develop symptoms. If you have been in contact with anyone that has tested positive in the last 10 days please notify you surgeon.      Pre-operative 5 CHG Bathing Instructions   You can play a key role in reducing the risk of infection after surgery. Your skin needs to be as free of germs as possible. You can reduce the number of germs on your skin by washing with CHG (chlorhexidine gluconate) soap before surgery. CHG is an antiseptic soap that kills germs and continues to kill germs even after washing.   DO NOT use if you have an allergy to chlorhexidine/CHG or antibacterial soaps. If your skin becomes reddened or irritated, stop using the CHG and notify one of our RNs at [phone].   Please shower with the CHG soap starting 4 days before surgery using the following schedule:     Please keep in mind the following:  DO NOT shave, including legs and underarms, starting the day of your first shower.   You  may shave your face at any point before/day of surgery.  Place clean sheets on your bed the day you start using CHG soap. Use a clean washcloth (not used since being washed) for each shower. DO NOT sleep with pets once you start using the CHG.   CHG Shower Instructions:  Wash your face and private area with normal soap. If you choose to wash your hair, wash first with your normal shampoo.  After you use shampoo/soap, rinse your hair and body thoroughly to remove shampoo/soap residue.  Turn the water OFF and apply about 3 tablespoons (45 ml) of CHG soap to a CLEAN washcloth.  Apply CHG  soap ONLY FROM YOUR NECK DOWN TO YOUR TOES (washing for 3-5 minutes)  DO NOT use CHG soap on face, private areas, open wounds, or sores.  Pay special attention to the area where your surgery is being performed.  If you are having back surgery, having someone wash your back for you may be helpful. Wait 2 minutes after CHG soap is applied, then you may rinse off the CHG soap.  Pat dry with a clean towel  Put on clean clothes/pajamas   If you choose to wear lotion, please use ONLY the CHG-compatible lotions that are listed below.  Additional instructions for the day of surgery: DO NOT APPLY any lotions, deodorants, cologne, or perfumes.   Do not bring valuables to the hospital. [facility] is not responsible for any belongings/valuables. Do not wear nail polish, gel polish, artificial nails, or any other type of covering on natural nails (fingers and toes) Do not wear jewelry or makeup Put on clean/comfortable clothes.  Please brush your teeth.  Ask your nurse before applying any prescription medications to the skin.     CHG Compatible Lotions   Aveeno Moisturizing lotion  Cetaphil Moisturizing Cream  Cetaphil Moisturizing Lotion  Clairol Herbal Essence Moisturizing Lotion, Dry Skin  Clairol Herbal Essence Moisturizing Lotion, Extra Dry Skin  Clairol Herbal Essence Moisturizing Lotion, Normal Skin  Curel Age Defying Therapeutic Moisturizing Lotion with Alpha Hydroxy  Curel Extreme Care Body Lotion  Curel Soothing Hands Moisturizing Hand Lotion  Curel Therapeutic Moisturizing Cream, Fragrance-Free  Curel Therapeutic Moisturizing Lotion, Fragrance-Free  Curel Therapeutic Moisturizing Lotion, Original Formula  Eucerin Daily Replenishing Lotion  Eucerin Dry Skin Therapy Plus Alpha Hydroxy Crme  Eucerin Dry Skin Therapy Plus Alpha Hydroxy Lotion  Eucerin Original Crme  Eucerin Original Lotion  Eucerin Plus Crme Eucerin Plus Lotion  Eucerin TriLipid Replenishing Lotion  Keri  Anti-Bacterial Hand Lotion  Keri Deep Conditioning Original Lotion Dry Skin Formula Softly Scented  Keri Deep Conditioning Original Lotion, Fragrance Free Sensitive Skin Formula  Keri Lotion Fast Absorbing Fragrance Free Sensitive Skin Formula  Keri Lotion Fast Absorbing Softly Scented Dry Skin Formula  Keri Original Lotion  Keri Skin Renewal Lotion Keri Silky Smooth Lotion  Keri Silky Smooth Sensitive Skin Lotion  Nivea Body Creamy Conditioning Oil  Nivea Body Extra Enriched Lotion  Nivea Body Original Lotion  Nivea Body Sheer Moisturizing Lotion Nivea Crme  Nivea Skin Firming Lotion  NutraDerm 30 Skin Lotion  NutraDerm Skin Lotion  NutraDerm Therapeutic Skin Cream  NutraDerm Therapeutic Skin Lotion  ProShield Protective Hand Cream  Provon moisturizing lotion  Please read over the following fact sheets that you were given.

## 2023-10-20 ENCOUNTER — Other Ambulatory Visit: Payer: Self-pay

## 2023-10-20 ENCOUNTER — Encounter (HOSPITAL_COMMUNITY)
Admission: RE | Disposition: A | Discharge status: Home / Self Care | Payer: Self-pay | Source: Home / Self Care | Attending: Neurosurgery

## 2023-10-20 ENCOUNTER — Ambulatory Visit (HOSPITAL_COMMUNITY): Payer: Self-pay | Admitting: Anesthesiology

## 2023-10-20 ENCOUNTER — Ambulatory Visit (HOSPITAL_COMMUNITY)

## 2023-10-20 ENCOUNTER — Ambulatory Visit (HOSPITAL_BASED_OUTPATIENT_CLINIC_OR_DEPARTMENT_OTHER): Payer: Self-pay | Admitting: Anesthesiology

## 2023-10-20 ENCOUNTER — Encounter (HOSPITAL_COMMUNITY): Payer: Self-pay | Admitting: Neurosurgery

## 2023-10-20 ENCOUNTER — Ambulatory Visit (HOSPITAL_COMMUNITY)
Admission: RE | Admit: 2023-10-20 | Discharge: 2023-10-21 | Disposition: A | Discharge status: Home / Self Care | Payer: 59 | Attending: Neurosurgery | Admitting: Neurosurgery

## 2023-10-20 DIAGNOSIS — F419 Anxiety disorder, unspecified: Secondary | ICD-10-CM | POA: Diagnosis not present

## 2023-10-20 DIAGNOSIS — M4726 Other spondylosis with radiculopathy, lumbar region: Secondary | ICD-10-CM | POA: Insufficient documentation

## 2023-10-20 DIAGNOSIS — M48062 Spinal stenosis, lumbar region with neurogenic claudication: Secondary | ICD-10-CM | POA: Diagnosis not present

## 2023-10-20 DIAGNOSIS — Z79899 Other long term (current) drug therapy: Secondary | ICD-10-CM | POA: Diagnosis not present

## 2023-10-20 DIAGNOSIS — K219 Gastro-esophageal reflux disease without esophagitis: Secondary | ICD-10-CM | POA: Insufficient documentation

## 2023-10-20 DIAGNOSIS — M4316 Spondylolisthesis, lumbar region: Secondary | ICD-10-CM | POA: Diagnosis not present

## 2023-10-20 LAB — ABO/RH: ABO/RH(D): A POS

## 2023-10-20 SURGERY — POSTERIOR LUMBAR FUSION 1 LEVEL
Anesthesia: General | Site: Spine Lumbar

## 2023-10-20 MED ORDER — HYDROMORPHONE HCL 1 MG/ML IJ SOLN
INTRAMUSCULAR | Status: DC | PRN
  Administered 2023-10-20: .5 mg via INTRAVENOUS

## 2023-10-20 MED ORDER — THROMBIN 5000 UNITS EX KIT
PACK | CUTANEOUS | Status: AC
  Filled 2023-10-20: qty 1

## 2023-10-20 MED ORDER — PROPOFOL 500 MG/50ML IV EMUL
INTRAVENOUS | Status: DC | PRN
  Administered 2023-10-20: 90 ug/kg/min via INTRAVENOUS

## 2023-10-20 MED ORDER — TRAZODONE HCL 50 MG PO TABS: 150.0000 mg | ORAL_TABLET | Freq: Every evening | ORAL | Status: DC | PRN

## 2023-10-20 MED ORDER — HYDROMORPHONE HCL 1 MG/ML IJ SOLN
INTRAMUSCULAR | Status: AC
Start: 2023-10-20 — End: ?
  Filled 2023-10-20: qty 0.5

## 2023-10-20 MED ORDER — MENTHOL 3 MG MT LOZG: 1.0000 | LOZENGE | OROMUCOSAL | Status: DC | PRN

## 2023-10-20 MED ORDER — CEFAZOLIN SODIUM-DEXTROSE 2-4 GM/100ML-% IV SOLN
2.0000 g | Freq: Three times a day (TID) | INTRAVENOUS | Status: AC
  Administered 2023-10-20 – 2023-10-21 (×2): 2 g via INTRAVENOUS
  Filled 2023-10-20 (×2): qty 100

## 2023-10-20 MED ORDER — BUPIVACAINE-EPINEPHRINE (PF) 0.5% -1:200000 IJ SOLN
INTRAMUSCULAR | Status: AC
  Filled 2023-10-20: qty 30

## 2023-10-20 MED ORDER — ONDANSETRON HCL 4 MG/2ML IJ SOLN: 4.0000 mg | Freq: Four times a day (QID) | INTRAMUSCULAR | Status: DC | PRN

## 2023-10-20 MED ORDER — CHLORHEXIDINE GLUCONATE CLOTH 2 % EX PADS: 6.0000 | MEDICATED_PAD | Freq: Once | CUTANEOUS | Status: DC

## 2023-10-20 MED ORDER — GABAPENTIN 300 MG PO CAPS: 300.0000 mg | ORAL_CAPSULE | Freq: Every day | ORAL | Status: DC

## 2023-10-20 MED ORDER — ROCURONIUM BROMIDE 10 MG/ML (PF) SYRINGE
PREFILLED_SYRINGE | INTRAVENOUS | Status: AC
Start: 2023-10-20 — End: ?
  Filled 2023-10-20: qty 10

## 2023-10-20 MED ORDER — LORAZEPAM 0.5 MG PO TABS: 1.0000 mg | ORAL_TABLET | Freq: Every evening | ORAL | Status: DC | PRN

## 2023-10-20 MED ORDER — PANTOPRAZOLE SODIUM 40 MG PO TBEC: 40.0000 mg | DELAYED_RELEASE_TABLET | Freq: Every day | ORAL | Status: DC

## 2023-10-20 MED ORDER — CYCLOBENZAPRINE HCL 10 MG PO TABS
10.0000 mg | ORAL_TABLET | Freq: Three times a day (TID) | ORAL | Status: DC | PRN
  Administered 2023-10-20 – 2023-10-21 (×2): 10 mg via ORAL
  Filled 2023-10-20 (×2): qty 1

## 2023-10-20 MED ORDER — FENTANYL CITRATE (PF) 250 MCG/5ML IJ SOLN
INTRAMUSCULAR | Status: DC | PRN
  Administered 2023-10-20: 50 ug via INTRAVENOUS
  Administered 2023-10-20: 100 ug via INTRAVENOUS
  Administered 2023-10-20 (×2): 50 ug via INTRAVENOUS

## 2023-10-20 MED ORDER — SUGAMMADEX SODIUM 200 MG/2ML IV SOLN
INTRAVENOUS | Status: AC
Start: 2023-10-20 — End: ?
  Filled 2023-10-20: qty 2

## 2023-10-20 MED ORDER — ONDANSETRON HCL 4 MG/2ML IJ SOLN
INTRAMUSCULAR | Status: DC | PRN
  Administered 2023-10-20: 4 mg via INTRAVENOUS

## 2023-10-20 MED ORDER — ZOLPIDEM TARTRATE 5 MG PO TABS: 5.0000 mg | ORAL_TABLET | Freq: Every evening | ORAL | Status: DC | PRN

## 2023-10-20 MED ORDER — PHENYLEPHRINE HCL-NACL 20-0.9 MG/250ML-% IV SOLN
INTRAVENOUS | Status: DC | PRN
  Administered 2023-10-20: 40 ug/min via INTRAVENOUS

## 2023-10-20 MED ORDER — HYDROMORPHONE HCL 2 MG PO TABS: 2.0000 mg | ORAL_TABLET | ORAL | Status: DC | PRN

## 2023-10-20 MED ORDER — ACETAMINOPHEN 325 MG PO TABS: 650.0000 mg | ORAL_TABLET | ORAL | Status: DC | PRN

## 2023-10-20 MED ORDER — HYDROMORPHONE HCL 2 MG PO TABS
2.0000 mg | ORAL_TABLET | ORAL | Status: DC | PRN
  Administered 2023-10-20 (×2): 2 mg via ORAL
  Administered 2023-10-21: 4 mg via ORAL
  Filled 2023-10-20: qty 2
  Filled 2023-10-20 (×2): qty 1

## 2023-10-20 MED ORDER — MORPHINE SULFATE (PF) 2 MG/ML IV SOLN: 2.0000 mg | INTRAVENOUS | Status: DC | PRN

## 2023-10-20 MED ORDER — SENNA 8.6 MG PO TABS
1.0000 | ORAL_TABLET | Freq: Two times a day (BID) | ORAL | Status: DC | PRN
  Administered 2023-10-20: 8.6 mg via ORAL
  Filled 2023-10-20: qty 1

## 2023-10-20 MED ORDER — LIDOCAINE 2% (20 MG/ML) 5 ML SYRINGE
INTRAMUSCULAR | Status: DC | PRN
  Administered 2023-10-20: 50 mg via INTRAVENOUS

## 2023-10-20 MED ORDER — PROPOFOL 10 MG/ML IV BOLUS
INTRAVENOUS | Status: DC | PRN
Start: 2023-10-20 — End: 2023-10-20
  Administered 2023-10-20: 140 mg via INTRAVENOUS

## 2023-10-20 MED ORDER — GLYCOPYRROLATE PF 0.2 MG/ML IJ SOSY
PREFILLED_SYRINGE | INTRAMUSCULAR | Status: DC | PRN
  Administered 2023-10-20 (×2): .1 mg via INTRAVENOUS

## 2023-10-20 MED ORDER — ROCURONIUM BROMIDE 10 MG/ML (PF) SYRINGE
PREFILLED_SYRINGE | INTRAVENOUS | Status: AC
  Filled 2023-10-20: qty 10

## 2023-10-20 MED ORDER — GLYCOPYRROLATE PF 0.2 MG/ML IJ SOSY
PREFILLED_SYRINGE | INTRAMUSCULAR | Status: AC
  Filled 2023-10-20: qty 1

## 2023-10-20 MED ORDER — SCOPOLAMINE 1 MG/3DAYS TD PT72
1.0000 | MEDICATED_PATCH | Freq: Once | TRANSDERMAL | Status: DC
  Administered 2023-10-20: 1.5 mg via TRANSDERMAL
  Filled 2023-10-20: qty 1

## 2023-10-20 MED ORDER — 0.9 % SODIUM CHLORIDE (POUR BTL) OPTIME
TOPICAL | Status: DC | PRN
  Administered 2023-10-20: 1000 mL

## 2023-10-20 MED ORDER — SODIUM CHLORIDE 0.9 % IV SOLN
250.0000 mL | INTRAVENOUS | Status: DC
  Administered 2023-10-20: 250 mL via INTRAVENOUS

## 2023-10-20 MED ORDER — BUPIVACAINE LIPOSOME 1.3 % IJ SUSP
INTRAMUSCULAR | Status: AC
  Filled 2023-10-20: qty 20

## 2023-10-20 MED ORDER — ESTRADIOL 0.5 MG PO TABS
0.5000 mg | ORAL_TABLET | Freq: Every day | ORAL | Status: DC
  Filled 2023-10-20: qty 1

## 2023-10-20 MED ORDER — LIDOCAINE 2% (20 MG/ML) 5 ML SYRINGE
INTRAMUSCULAR | Status: AC
  Filled 2023-10-20: qty 5

## 2023-10-20 MED ORDER — PROPOFOL 1000 MG/100ML IV EMUL
INTRAVENOUS | Status: AC
  Filled 2023-10-20: qty 100

## 2023-10-20 MED ORDER — LACTATED RINGERS IV SOLN: INTRAVENOUS | Status: DC | PRN

## 2023-10-20 MED ORDER — ONDANSETRON HCL 4 MG PO TABS: 4.0000 mg | ORAL_TABLET | Freq: Four times a day (QID) | ORAL | Status: DC | PRN

## 2023-10-20 MED ORDER — BUPIVACAINE-EPINEPHRINE (PF) 0.5% -1:200000 IJ SOLN
INTRAMUSCULAR | Status: DC | PRN
  Administered 2023-10-20: 10 mL

## 2023-10-20 MED ORDER — ORAL CARE MOUTH RINSE: 15.0000 mL | Freq: Once | OROMUCOSAL | Status: AC

## 2023-10-20 MED ORDER — SODIUM CHLORIDE 0.9% FLUSH: 3.0000 mL | INTRAVENOUS | Status: DC | PRN

## 2023-10-20 MED ORDER — ACETAMINOPHEN 650 MG RE SUPP: 650.0000 mg | RECTAL | Status: DC | PRN

## 2023-10-20 MED ORDER — SUGAMMADEX SODIUM 200 MG/2ML IV SOLN
INTRAVENOUS | Status: AC
  Filled 2023-10-20: qty 2

## 2023-10-20 MED ORDER — PHENOL 1.4 % MT LIQD: 1.0000 | OROMUCOSAL | Status: DC | PRN

## 2023-10-20 MED ORDER — BISACODYL 10 MG RE SUPP: 10.0000 mg | Freq: Every day | RECTAL | Status: DC | PRN

## 2023-10-20 MED ORDER — CHLORHEXIDINE GLUCONATE 0.12 % MT SOLN
15.0000 mL | Freq: Once | OROMUCOSAL | Status: AC
  Administered 2023-10-20: 15 mL via OROMUCOSAL
  Filled 2023-10-20: qty 15

## 2023-10-20 MED ORDER — KETAMINE HCL 10 MG/ML IJ SOLN
INTRAMUSCULAR | Status: DC | PRN
  Administered 2023-10-20 (×2): 10 mg via INTRAVENOUS
  Administered 2023-10-20: 30 mg via INTRAVENOUS

## 2023-10-20 MED ORDER — MAGNESIUM SULFATE 50 % IJ SOLN
INTRAMUSCULAR | Status: DC | PRN
  Administered 2023-10-20: 2 g via INTRAVENOUS

## 2023-10-20 MED ORDER — GABAPENTIN 300 MG PO CAPS
300.0000 mg | ORAL_CAPSULE | Freq: Once | ORAL | Status: AC
  Administered 2023-10-20: 300 mg via ORAL
  Filled 2023-10-20: qty 1

## 2023-10-20 MED ORDER — VASHE WOUND IRRIGATION OPTIME
TOPICAL | Status: DC | PRN
  Administered 2023-10-20: 34 [oz_av] via TOPICAL

## 2023-10-20 MED ORDER — SODIUM CHLORIDE 0.9% FLUSH
3.0000 mL | Freq: Two times a day (BID) | INTRAVENOUS | Status: DC
  Administered 2023-10-20: 3 mL via INTRAVENOUS

## 2023-10-20 MED ORDER — BACITRACIN ZINC 500 UNIT/GM EX OINT
TOPICAL_OINTMENT | CUTANEOUS | Status: AC
  Filled 2023-10-20: qty 28.35

## 2023-10-20 MED ORDER — FENTANYL CITRATE (PF) 250 MCG/5ML IJ SOLN
INTRAMUSCULAR | Status: AC
  Filled 2023-10-20: qty 5

## 2023-10-20 MED ORDER — VENLAFAXINE HCL ER 75 MG PO CP24
75.0000 mg | ORAL_CAPSULE | Freq: Every day | ORAL | Status: DC
  Filled 2023-10-20: qty 1

## 2023-10-20 MED ORDER — ACETAMINOPHEN 500 MG PO TABS
1000.0000 mg | ORAL_TABLET | Freq: Once | ORAL | Status: AC
  Administered 2023-10-20: 1000 mg via ORAL
  Filled 2023-10-20: qty 2

## 2023-10-20 MED ORDER — THROMBIN 5000 UNITS EX SOLR: OROMUCOSAL | Status: DC | PRN

## 2023-10-20 MED ORDER — MIDAZOLAM HCL 2 MG/2ML IJ SOLN
INTRAMUSCULAR | Status: AC
Start: 2023-10-20 — End: ?
  Filled 2023-10-20: qty 2

## 2023-10-20 MED ORDER — ONDANSETRON HCL 4 MG/2ML IJ SOLN
INTRAMUSCULAR | Status: AC
  Filled 2023-10-20: qty 2

## 2023-10-20 MED ORDER — BACITRACIN ZINC 500 UNIT/GM EX OINT
TOPICAL_OINTMENT | CUTANEOUS | Status: DC | PRN
  Administered 2023-10-20: 1 via TOPICAL

## 2023-10-20 MED ORDER — VENLAFAXINE HCL ER 75 MG PO CP24: 75.0000 mg | ORAL_CAPSULE | Freq: Every day | ORAL | Status: DC

## 2023-10-20 MED ORDER — BUPIVACAINE LIPOSOME 1.3 % IJ SUSP
INTRAMUSCULAR | Status: DC | PRN
  Administered 2023-10-20: 20 mL

## 2023-10-20 MED ORDER — DOCUSATE SODIUM 100 MG PO CAPS
100.0000 mg | ORAL_CAPSULE | Freq: Two times a day (BID) | ORAL | Status: DC
  Administered 2023-10-20: 100 mg via ORAL
  Filled 2023-10-20: qty 1

## 2023-10-20 MED ORDER — DEXAMETHASONE SODIUM PHOSPHATE 10 MG/ML IJ SOLN
INTRAMUSCULAR | Status: AC
Start: 2023-10-20 — End: ?
  Filled 2023-10-20: qty 1

## 2023-10-20 MED ORDER — SUGAMMADEX SODIUM 200 MG/2ML IV SOLN
INTRAVENOUS | Status: DC | PRN
  Administered 2023-10-20: 150 mg via INTRAVENOUS

## 2023-10-20 MED ORDER — DEXAMETHASONE SODIUM PHOSPHATE 10 MG/ML IJ SOLN
INTRAMUSCULAR | Status: DC | PRN
  Administered 2023-10-20: 10 mg via INTRAVENOUS

## 2023-10-20 MED ORDER — KETAMINE HCL 50 MG/5ML IJ SOSY
PREFILLED_SYRINGE | INTRAMUSCULAR | Status: AC
  Filled 2023-10-20: qty 5

## 2023-10-20 MED ORDER — MIDAZOLAM HCL 2 MG/2ML IJ SOLN
INTRAMUSCULAR | Status: DC | PRN
  Administered 2023-10-20: 2 mg via INTRAVENOUS

## 2023-10-20 MED ORDER — CEFAZOLIN SODIUM-DEXTROSE 2-4 GM/100ML-% IV SOLN
2.0000 g | INTRAVENOUS | Status: AC
  Administered 2023-10-20: 2 g via INTRAVENOUS
  Filled 2023-10-20: qty 100

## 2023-10-20 MED ORDER — ROCURONIUM BROMIDE 10 MG/ML (PF) SYRINGE
PREFILLED_SYRINGE | INTRAVENOUS | Status: DC | PRN
  Administered 2023-10-20: 20 mg via INTRAVENOUS
  Administered 2023-10-20: 40 mg via INTRAVENOUS
  Administered 2023-10-20: 10 mg via INTRAVENOUS
  Administered 2023-10-20: 20 mg via INTRAVENOUS
  Administered 2023-10-20: 60 mg via INTRAVENOUS

## 2023-10-20 SURGICAL SUPPLY — 59 items
BAG COUNTER SPONGE SURGICOUNT (BAG) ×1 IMPLANT
BASKET BONE COLLECTION (BASKET) ×1 IMPLANT
BENZOIN TINCTURE PRP APPL 2/3 (GAUZE/BANDAGES/DRESSINGS) ×1 IMPLANT
BLADE CLIPPER SURG (BLADE) IMPLANT
BUR MATCHSTICK NEURO 3.0 LAGG (BURR) ×1 IMPLANT
BUR PRECISION FLUTE 6.0 (BURR) ×1 IMPLANT
CAGE ALTERA 10X31X9-13 15D (Cage) IMPLANT
CANISTER SUCT 3000ML PPV (MISCELLANEOUS) ×1 IMPLANT
CAP LOCK DLX THRD (Cap) IMPLANT
CLEANSER WND VASHE INSTL 34OZ (WOUND CARE) ×1 IMPLANT
CNTNR URN SCR LID CUP LEK RST (MISCELLANEOUS) ×1 IMPLANT
COVER BACK TABLE 60X90IN (DRAPES) ×1 IMPLANT
DRAPE C-ARM 42X72 X-RAY (DRAPES) ×2 IMPLANT
DRAPE HALF SHEET 40X57 (DRAPES) ×1 IMPLANT
DRAPE LAPAROTOMY 100X72X124 (DRAPES) ×1 IMPLANT
DRAPE SURG 17X23 STRL (DRAPES) ×1 IMPLANT
DRSG OPSITE POSTOP 4X6 (GAUZE/BANDAGES/DRESSINGS) ×1 IMPLANT
ELECT BLADE 4.0 EZ CLEAN MEGAD (MISCELLANEOUS) ×1 IMPLANT
ELECT REM PT RETURN 9FT ADLT (ELECTROSURGICAL) ×1 IMPLANT
ELECTRODE BLDE 4.0 EZ CLN MEGD (MISCELLANEOUS) ×1 IMPLANT
ELECTRODE REM PT RTRN 9FT ADLT (ELECTROSURGICAL) ×1 IMPLANT
EVACUATOR 1/8 PVC DRAIN (DRAIN) IMPLANT
GAUZE 4X4 16PLY ~~LOC~~+RFID DBL (SPONGE) ×1 IMPLANT
GLOVE BIO SURGEON STRL SZ 6 (GLOVE) ×1 IMPLANT
GLOVE BIO SURGEON STRL SZ8 (GLOVE) ×2 IMPLANT
GLOVE BIO SURGEON STRL SZ8.5 (GLOVE) ×2 IMPLANT
GLOVE BIOGEL PI IND STRL 6.5 (GLOVE) ×1 IMPLANT
GLOVE EXAM NITRILE XL STR (GLOVE) IMPLANT
GOWN STRL REUS W/ TWL LRG LVL3 (GOWN DISPOSABLE) ×1 IMPLANT
GOWN STRL REUS W/ TWL XL LVL3 (GOWN DISPOSABLE) ×2 IMPLANT
GOWN STRL REUS W/TWL 2XL LVL3 (GOWN DISPOSABLE) IMPLANT
HEMOSTAT POWDER KIT SURGIFOAM (HEMOSTASIS) ×1 IMPLANT
KIT BASIN OR (CUSTOM PROCEDURE TRAY) ×1 IMPLANT
KIT GRAFTMAG DEL NEURO DISP (NEUROSURGERY SUPPLIES) IMPLANT
KIT POSITION SURG JACKSON T1 (MISCELLANEOUS) ×1 IMPLANT
KIT TURNOVER KIT B (KITS) ×1 IMPLANT
NDL HYPO 21X1.5 SAFETY (NEEDLE) ×1 IMPLANT
NDL HYPO 22X1.5 SAFETY MO (MISCELLANEOUS) ×1 IMPLANT
NEEDLE HYPO 21X1.5 SAFETY (NEEDLE) ×1 IMPLANT
NEEDLE HYPO 22X1.5 SAFETY MO (MISCELLANEOUS) ×1 IMPLANT
NS IRRIG 1000ML POUR BTL (IV SOLUTION) ×1 IMPLANT
PACK LAMINECTOMY NEURO (CUSTOM PROCEDURE TRAY) ×1 IMPLANT
PAD ARMBOARD 7.5X6 YLW CONV (MISCELLANEOUS) ×3 IMPLANT
PATTIES SURGICAL .5 X1 (DISPOSABLE) IMPLANT
PUTTY DBM 5CC CALC GRAN (Putty) IMPLANT
ROD CURVED TI 6.35X35 (Rod) IMPLANT
SCREW PA DLX CREO 7.5X50 (Screw) IMPLANT
SPIKE FLUID TRANSFER (MISCELLANEOUS) ×1 IMPLANT
SPONGE NEURO XRAY DETECT 1X3 (DISPOSABLE) IMPLANT
SPONGE SURGIFOAM ABS GEL 100 (HEMOSTASIS) IMPLANT
SPONGE T-LAP 4X18 ~~LOC~~+RFID (SPONGE) IMPLANT
STRIP CLOSURE SKIN 1/2X4 (GAUZE/BANDAGES/DRESSINGS) ×1 IMPLANT
SUT VIC AB 1 CT1 18XBRD ANBCTR (SUTURE) ×2 IMPLANT
SUT VIC AB 2-0 CP2 18 (SUTURE) ×2 IMPLANT
SYR 20ML LL LF (SYRINGE) IMPLANT
TOWEL GREEN STERILE (TOWEL DISPOSABLE) ×1 IMPLANT
TOWEL GREEN STERILE FF (TOWEL DISPOSABLE) ×1 IMPLANT
TRAY FOLEY MTR SLVR 16FR STAT (SET/KITS/TRAYS/PACK) ×1 IMPLANT
WATER STERILE IRR 1000ML POUR (IV SOLUTION) ×1 IMPLANT

## 2023-10-20 NOTE — Op Note (Signed)
 Brief history: The patient is a 58 year old white female whose had previous back surgery.  She is developed recurrent and worsening back and leg pain consistent with neurogenic claudication.  She failed medical management and was worked up with a lumbar MRI and lumbar x-rays which demonstrated L4-5 facet arthropathy and spondylolisthesis.  I discussed the various treatment options with her.  She has decided proceed with surgery.  Preoperative diagnosis: L4-5 facet arthropathy, spondylolisthesis, foraminal stenosis; lumbago; lumbar radiculopathy; neurogenic claudication  Postoperative diagnosis: The same  Procedure: Bilateral redo L4-5 laminotomy/foraminotomies/medial facetectomy to decompress the bilateral 4 and L5 nerve roots(the work required to do this was in addition to the work required to do the posterior lumbar interbody fusion because of the patient's prior surgery, facet arthropathy. Etc. requiring a wide decompression of the nerve roots.); L4-5 transforaminal lumbar interbody fusion with local morselized autograft bone and Zimmer DBM; insertion of interbody prosthesis at L4-5 (globus peek expandable interbody prosthesis); posterior nonsegmental instrumentation from L4 to L5 with globus titanium pedicle screws and rods; posterior lateral arthrodesis at L4-5 with local morselized autograft bone and Zimmer DBM.  Surgeon: Dr. Delma Officer  Asst.: Hildred Priest, NP  Anesthesia: Gen. endotracheal  Estimated blood loss: 200 cc  Drains: 1 medium Hemovac drain in the epidural space  Complications: None  Description of procedure: The patient was brought to the operating room by the anesthesia team. General endotracheal anesthesia was induced. The patient was turned to the prone position on the Wilson frame. The patient's lumbosacral region was then prepared with Betadine scrub and Betadine solution. Sterile drapes were applied.  I then injected the area to be incised with Marcaine with  epinephrine solution. I then used the scalpel to make a linear midline incision over the L4-5 interspace, incising through her old surgical scar. I then used electrocautery to perform a bilateral subperiosteal dissection exposing the spinous process and lamina of L4-5. We then obtained intraoperative radiograph to confirm our location. We then inserted the Verstrac retractor to provide exposure.  I began the decompression by using the high speed drill to perform redo laminotomies at L4-5 bilaterally. We then used the Kerrison punches to widen the laminotomy and removed the epidural scar tissue and the remainder of the ligamentum flavum at L4-5 bilaterally. We used the Kerrison punches to remove the medial facets at L4-5 bilaterally, we removed the left L4-5 facet. We performed wide foraminotomies about the bilateral L4 and L5 nerve roots completing the decompression.  We now turned our attention to the posterior lumbar interbody fusion. I used a scalpel to incise the intervertebral disc at L4-5 bilaterally. I then performed a partial intervertebral discectomy at L4-5 bilaterally using the pituitary forceps. We prepared the vertebral endplates at L4-5 bilaterally for the fusion by removing the soft tissues with the curettes. We then used the trial spacers to pick the appropriate sized interbody prosthesis. We prefilled his prosthesis with a combination of local morselized autograft bone that we obtained during the decompression as well as Zimmer DBM. We inserted the prefilled prosthesis into the interspace at L4-5 from the left, we then turned and expanded the prosthesis. There was a good snug fit of the prosthesis in the interspace. We then filled and the remainder of the intervertebral disc space with local morselized autograft bone and Zimmer DBM. This completed the posterior lumbar interbody arthrodesis.  During the decompression and insertion of the prosthesis the assistant protected the thecal sac and nerve  roots with the D'Errico retractor.  We now  turned attention to the instrumentation. Under fluoroscopic guidance we cannulated the bilateral L4 and L5 pedicles with the bone probe. We then removed the bone probe. We then tapped the pedicle with a 6.5 millimeter tap. We then removed the tap. We probed inside the tapped pedicle with a ball probe to rule out cortical breaches. We then inserted a 7.5 x 50 millimeter pedicle screw into the L4 and L5 pedicles bilaterally under fluoroscopic guidance. We then palpated along the medial aspect of the pedicles to rule out cortical breaches. There were none. The nerve roots were not injured. We then connected the unilateral pedicle screws with a lordotic rod. We compressed the construct and secured the rod in place with the caps. We then tightened the caps appropriately. This completed the instrumentation from L4-5.  We now turned our attention to the posterior lateral arthrodesis at L4-5. We used the high-speed drill to decorticate the remainder of the facets, pars, transverse process at L4-5.Marland Kitchen We then applied a combination of local morselized autograft bone and Zimmer DBM over these decorticated posterior lateral structures. This completed the posterior lateral arthrodesis.  We then obtained hemostasis using bipolar electrocautery. We irrigated the wound out with vashe solution. We inspected the thecal sac and nerve roots and noted they were well decompressed. We then removed the retractor.  We placed a medium Hemovac drain in the epidural space and tunneled it out through a separate stab wound.  We injected Exparel . We reapproximated patient's thoracolumbar fascia with interrupted #1 Vicryl suture. We reapproximated patient's subcutaneous tissue with interrupted 2-0 Vicryl suture. The reapproximated patient's skin with Steri-Strips and benzoin. The wound was then coated with bacitracin ointment. A sterile dressing was applied. The drapes were removed. The patient was  subsequently returned to the supine position where they were extubated by the anesthesia team. He was then transported to the post anesthesia care unit in stable condition. All sponge instrument and needle counts were reportedly correct at the end of this case.

## 2023-10-20 NOTE — Transfer of Care (Signed)
 Immediate Anesthesia Transfer of Care Note  Patient: Veronica Montes  Procedure(s) Performed: LUMBAR FOUR-FIVE POSTERIOR LUMBAR INTERBODY FUSION (Spine Lumbar)  Patient Location: PACU  Anesthesia Type:General  Level of Consciousness: awake, drowsy, patient cooperative, and responds to stimulation  Airway & Oxygen Therapy: Patient Spontanous Breathing and Patient connected to face mask oxygen  Post-op Assessment: Report given to RN and Post -op Vital signs reviewed and stable  Post vital signs: Reviewed and stable  Last Vitals:  Vitals Value Taken Time  BP 113/78 10/20/23 1623  Temp    Pulse 81 10/20/23 1624  Resp 9 10/20/23 1624  SpO2 100 % 10/20/23 1624  Vitals shown include unfiled device data.  Last Pain:  Vitals:   10/20/23 1033  TempSrc:   PainSc: 4       Patients Stated Pain Goal: 0 (10/20/23 1033)  Complications: No notable events documented.

## 2023-10-20 NOTE — H&P (Signed)
 Subjective: The patient is a 58 year old white female who has complained of back and left greater right leg pain.  She has failed medical management.  She was worked up with lumbar x-rays and lumbar MRI which demonstrated a lumbar spine listhesis with spinal stenosis.  I discussed the various treatment options with her.  She has decided proceed with surgery.  Past Medical History:  Diagnosis Date   Anxiety    GERD (gastroesophageal reflux disease)    History of kidney stones 1991   Melanoma (HCC) 2011   Resected from Right upper chest area.    Past Surgical History:  Procedure Laterality Date   ABDOMINAL HYSTERECTOMY  2002   AUGMENTATION MAMMAPLASTY Bilateral 10+yrs ago   BACK SURGERY     BREAST BIOPSY Left 2014   bx/clip-neg   COLONOSCOPY WITH PROPOFOL N/A 09/12/2016   Procedure: COLONOSCOPY WITH PROPOFOL;  Surgeon: Wyline Mood, MD;  Location: [facility];  Service: Endoscopy;  Laterality: N/A;   ESOPHAGOGASTRODUODENOSCOPY (EGD) WITH PROPOFOL N/A 09/12/2016   Procedure: ESOPHAGOGASTRODUODENOSCOPY (EGD) WITH PROPOFOL;  Surgeon: Wyline Mood, MD;  Location: [facility] ENDOSCOPY;  Service: Endoscopy;  Laterality: N/A;   SHOULDER ARTHROSCOPY WITH OPEN ROTATOR CUFF REPAIR AND DISTAL CLAVICLE ACROMINECTOMY Left 08/15/2017   Procedure: SHOULDER ARTHROSCOPY WITH ROTATOR CUFF REPAIR AND SUBACROMINAL DECOMPRESSION;  Surgeon: Juanell Fairly, MD;  Location: [facility] ORS;  Service: Orthopedics;  Laterality: Left;  possible 23410 rotator cuff repair   TUBAL LIGATION      Allergies  Allergen Reactions   Nsaids Swelling    Face and eyes swell   Percocet [Oxycodone-Acetaminophen] Swelling    Social History   Tobacco Use   Smoking status: Never   Smokeless tobacco: Never  Substance Use Topics   Alcohol use: No    Family History  Problem Relation Age of Onset   Heart disease Brother    Breast cancer Neg Hx    Prior to Admission medications   Medication Sig Start Date End Date Taking? Authorizing  Provider  acetaminophen (TYLENOL) 650 MG CR tablet Take 1,300 mg by mouth every 8 (eight) hours as needed for pain.   Yes [provider]  desvenlafaxine (PRISTIQ) 50 MG 24 hr tablet Take 50 mg by mouth daily.   Yes [provider]  estradiol (ESTRACE) 0.5 MG tablet Take 0.5 mg by mouth daily.   Yes [provider]  gabapentin (NEURONTIN) 300 MG capsule Take 300 mg by mouth at bedtime.   Yes [provider]  LORazepam (ATIVAN) 1 MG tablet Take 1 to 2 tablets at bedtime as needed Patient taking differently: Take 2 mg by mouth at bedtime. 04/14/15  Yes Bosie Clos, MD  meloxicam (MOBIC) 7.5 MG tablet Take 7.5 mg by mouth daily. 01/20/18  Yes [provider]  omeprazole (PRILOSEC) 40 MG capsule Take 40 mg by mouth daily. 06/16/23  Yes [provider]  traZODone (DESYREL) 150 MG tablet 150 mg at bedtime.  07/26/16  Yes [provider]  traMADol (ULTRAM) 50 MG tablet Take 50 mg by mouth every 4 (four) hours as needed. Patient not taking: Reported on 10/13/2023    [provider]     Review of Systems  Positive ROS: As above  All other systems have been reviewed and were otherwise negative with the exception of those mentioned in the HPI and as above.  Objective: Vital signs in last 24 hours: Temp:  [97.9 F (36.6 C)] 97.9 F (36.6 C) (03/03 1012) Pulse Rate:  [88] 88 (03/03  1012) Resp:  [18] 18 (03/03 1012) BP: (142)/(91) 142/91 (03/03 1012) SpO2:  [99 %] 99 % (03/03 1012) Weight:  [61.7 kg] 61.7 kg (03/03 1012) Estimated body mass index is 24.09 kg/m as calculated from the following:   Height as of this encounter: 5\' 3"  (1.6 m).   Weight as of this encounter: 61.7 kg.   General Appearance: Alert Head: Normocephalic, without obvious abnormality, atraumatic Eyes: PERRL, conjunctiva/corneas clear, EOM's intact,    Ears: Normal  Throat: Normal  Neck: Supple, Back: unremarkable Lungs: Clear to auscultation  bilaterally, respirations unlabored Heart: Regular rate and rhythm, no murmur, rub or gallop Abdomen: Soft, non-tender Extremities: Extremities normal, atraumatic, no cyanosis or edema Skin: unremarkable  NEUROLOGIC:   Mental status: alert and oriented,Motor Exam - grossly normal Sensory Exam - grossly normal Reflexes:  Coordination - grossly normal Gait - grossly normal Balance - grossly normal Cranial Nerves: I: smell Not tested  II: visual acuity  OS: Normal  OD: Normal   II: visual fields Full to confrontation  II: pupils Equal, round, reactive to light  III,VII: ptosis None  III,IV,VI: extraocular muscles  Full ROM  V: mastication Normal  V: facial light touch sensation  Normal  V,VII: corneal reflex  Present  VII: facial muscle function - upper  Normal  VII: facial muscle function - lower Normal  VIII: hearing Not tested  IX: soft palate elevation  Normal  IX,X: gag reflex Present  XI: trapezius strength  5/5  XI: sternocleidomastoid strength 5/5  XI: neck flexion strength  5/5  XII: tongue strength  Normal    Data Review Lab Results  Component Value Date   WBC 7.3 10/13/2023   HGB 12.9 10/13/2023   HCT 38.7 10/13/2023   MCV 86.6 10/13/2023   PLT 283 10/13/2023   Lab Results  Component Value Date   NA 137 10/13/2023   K 3.8 10/13/2023   CL 104 10/13/2023   CO2 28 10/13/2023   BUN 20 10/13/2023   CREATININE 1.01 (H) 10/13/2023   GLUCOSE 88 10/13/2023   Lab Results  Component Value Date   INR 0.93 08/13/2017    Assessment/Plan: Spine listhesis, lumbar facet neuropathy, lumbago: I have discussed the situation with the patient.  I reviewed her imaging studies with her and pointed out the abnormalities.  We have discussed the various treatment options including surgery.  I have described the surgical treatment option of an L4-5 decompression, instrumentation and fusion.  I have shown her surgical models.  I have given her surgical pamphlet.  We have  discussed the risk, benefits, alternatives, expected postoperative course, and likelihood of achieving our goals with surgery.  I have answered all her questions.  She has decided proceed with surgery.   Veronica Montes 10/20/2023 11:42 AM

## 2023-10-20 NOTE — Plan of Care (Signed)

## 2023-10-20 NOTE — Progress Notes (Signed)
 Orthopedic Tech Progress Note Patient Details:  Veronica Montes 1966-05-08 [id number]  Patient has back brace per RN   Patient ID: Veronica Montes, female   DOB: 01-Oct-1965, 58 y.o.   MRN: [id number]  Veronica Montes 10/20/2023, 6:42 PM

## 2023-10-20 NOTE — Anesthesia Preprocedure Evaluation (Signed)
 Anesthesia Evaluation  Patient identified by MRN, date of birth, ID band Patient awake    Reviewed: Allergy & Precautions, NPO status , Patient's Chart, lab work & pertinent test results  Airway Mallampati: II  TM Distance: >3 FB Neck ROM: Full    Dental  (+) Teeth Intact, Dental Advisory Given   Pulmonary neg pulmonary ROS   Pulmonary exam normal breath sounds clear to auscultation       Cardiovascular negative cardio ROS Normal cardiovascular exam Rhythm:Regular Rate:Normal     Neuro/Psych  PSYCHIATRIC DISORDERS Anxiety     SPONDYLOLISTHESIS, LUMBAR REGION    GI/Hepatic Neg liver ROS,GERD  Medicated,,  Endo/Other  negative endocrine ROS    Renal/GU negative Renal ROS     Musculoskeletal negative musculoskeletal ROS (+)    Abdominal   Peds  Hematology negative hematology ROS (+)   Anesthesia Other Findings Day of surgery medications reviewed with the patient.  Reproductive/Obstetrics                             Anesthesia Physical Anesthesia Plan  ASA: 2  Anesthesia Plan: General   Post-op Pain Management: Tylenol PO (pre-op)* and Gabapentin PO (pre-op)*   Induction: Intravenous  PONV Risk Score and Plan: 3 and Midazolam, Propofol infusion, Dexamethasone and Ondansetron  Airway Management Planned: Oral ETT  Additional Equipment:   Intra-op Plan:   Post-operative Plan: Extubation in OR  Informed Consent: I have reviewed the patients History and Physical, chart, labs and discussed the procedure including the risks, benefits and alternatives for the proposed anesthesia with the patient or authorized representative who has indicated his/her understanding and acceptance.     Dental advisory given  Plan Discussed with: CRNA  Anesthesia Plan Comments:        Anesthesia Quick Evaluation

## 2023-10-20 NOTE — Anesthesia Procedure Notes (Signed)
 Procedure Name: Intubation Date/Time: 10/20/2023 12:41 PM  Performed by: Ayesha Rumpf, CRNAPre-anesthesia Checklist: Patient identified, Emergency Drugs available, Suction available and Patient being monitored Patient Re-evaluated:Patient Re-evaluated prior to induction Oxygen Delivery Method: Circle System Utilized Preoxygenation: Pre-oxygenation with 100% oxygen Induction Type: IV induction Ventilation: Mask ventilation without difficulty Laryngoscope Size: Mac and 3 Grade View: Grade I Tube type: Oral Tube size: 7.0 mm Number of attempts: 1 Airway Equipment and Method: Stylet and Oral airway Placement Confirmation: ETT inserted through vocal cords under direct vision, positive ETCO2 and breath sounds checked- equal and bilateral Secured at: 21 cm Tube secured with: Tape Dental Injury: Teeth and Oropharynx as per pre-operative assessment

## 2023-10-21 DIAGNOSIS — M4726 Other spondylosis with radiculopathy, lumbar region: Secondary | ICD-10-CM | POA: Diagnosis not present

## 2023-10-21 DIAGNOSIS — Z79899 Other long term (current) drug therapy: Secondary | ICD-10-CM | POA: Diagnosis not present

## 2023-10-21 DIAGNOSIS — F419 Anxiety disorder, unspecified: Secondary | ICD-10-CM | POA: Diagnosis not present

## 2023-10-21 DIAGNOSIS — M48062 Spinal stenosis, lumbar region with neurogenic claudication: Secondary | ICD-10-CM | POA: Diagnosis not present

## 2023-10-21 DIAGNOSIS — M4316 Spondylolisthesis, lumbar region: Secondary | ICD-10-CM | POA: Diagnosis not present

## 2023-10-21 DIAGNOSIS — K219 Gastro-esophageal reflux disease without esophagitis: Secondary | ICD-10-CM | POA: Diagnosis not present

## 2023-10-21 MED ORDER — DOCUSATE SODIUM 100 MG PO CAPS: 100.0000 mg | ORAL_CAPSULE | Freq: Two times a day (BID) | ORAL | 0 refills | Status: AC

## 2023-10-21 MED ORDER — CYCLOBENZAPRINE HCL 10 MG PO TABS: 10.0000 mg | ORAL_TABLET | Freq: Three times a day (TID) | ORAL | 0 refills | Status: AC | PRN

## 2023-10-21 MED ORDER — HYDROMORPHONE HCL 2 MG PO TABS: 2.0000 mg | ORAL_TABLET | ORAL | 0 refills | Status: AC | PRN

## 2023-10-21 NOTE — Evaluation (Signed)
 Physical Therapy Evaluation  Patient Details Name: Veronica Montes MRN: [id number] DOB: 09-Dec-1965 Today's Date: 10/21/2023  History of Present Illness  Pt is a 58 y/o female who presents s/p PLIF on 10/20/2023. PMH significant for Anxiety, kidney stones, rotator cuff repair L.  Clinical Impression  Pt admitted with above diagnosis. At the time of PT eval, pt was able to demonstrate transfers and ambulation with gross CGA to supervision for safety and [facility] for support. Pt was educated on precautions, brace application/wearing schedule, appropriate activity progression, and car transfer. Pt currently with functional limitations due to the deficits listed below (see PT Problem List). Pt will benefit from skilled PT to increase their independence and safety with mobility to allow discharge to the venue listed below.          If plan is discharge home, recommend the following: A little help with walking and/or transfers;A little help with bathing/dressing/bathroom;Assistance with cooking/housework;Assist for transportation;Help with stairs or ramp for entrance   Can travel by private vehicle        Equipment Recommendations BSC/3in1;Cane  Recommendations for Other Services       Functional Status Assessment Patient has had a recent decline in their functional status and demonstrates the ability to make significant improvements in function in a reasonable and predictable amount of time.     Precautions / Restrictions Precautions Precautions: Fall;Back Precaution Booklet Issued: Yes (comment) Recall of Precautions/Restrictions: Intact Precaution/Restrictions Comments: Reviewed handout and pt was cued for precautions during functional mobility. Required Braces or Orthoses: Spinal Brace Spinal Brace: Lumbar corset;Applied in sitting position Restrictions Weight Bearing Restrictions Per Provider Order: No      Mobility  Bed Mobility Overal bed mobility: Needs Assistance Bed Mobility:  Rolling, Sidelying to Sit Rolling: Supervision Sidelying to sit: Supervision       General bed mobility comments: VC's for optimal log roll technique. HOB flat and rails lowered to simulate home environment.    Transfers Overall transfer level: Needs assistance Equipment used: None, Straight cane Transfers: Sit to/from Stand Sit to Stand: Supervision           General transfer comment: VC's for hand placement on seated surface for safety and wide BOS. No assist required for power up to full stand.    Ambulation/Gait Ambulation/Gait assistance: Supervision Gait Distance (Feet): 350 Feet Assistive device: None, Straight cane Gait Pattern/deviations: Step-through pattern, Decreased stride length, Trunk flexed, Wide base of support Gait velocity: Decreased Gait velocity interpretation: 1.31 - 2.62 ft/sec, indicative of limited community ambulator   General Gait Details: VC's for improved posture and forward gaze. No assist required but pt reaching out for external support in the hallways. Improved with SPC.  Stairs Stairs: Yes Stairs assistance: Contact guard assist Stair Management: Step to pattern, Forwards, With cane Number of Stairs: 3 General stair comments: Daughter present for education. Daugher instructed in proper guarding/assist technique on the stairs. PT guarding both pt and daughter as they maneuvered the stairs.  Wheelchair Mobility     Tilt Bed    Modified Rankin (Stroke Patients Only)       Balance Overall balance assessment: Needs assistance, Mild deficits observed, not formally tested Sitting-balance support: No upper extremity supported, Feet supported Sitting balance-Leahy Scale: Fair     Standing balance support: No upper extremity supported, During functional activity, Reliant on assistive device for balance Standing balance-Leahy Scale: Poor  Pertinent Vitals/Pain Pain Assessment Pain Assessment:  Faces Faces Pain Scale: Hurts even more Pain Location: Incision site Pain Descriptors / Indicators: Operative site guarding, Sore Pain Intervention(s): Limited activity within patient's tolerance, Monitored during session, Repositioned    Home Living Family/patient expects to be discharged to:: Private residence Living Arrangements: Spouse/significant other Available Help at Discharge: Family;Available 24 hours/day Type of Home: House Home Access: Stairs to enter Entrance Stairs-Rails: Right Entrance Stairs-Number of Steps: 3   Home Layout: One level Home Equipment: None      Prior Function Prior Level of Function : Independent/Modified Independent                     Extremity/Trunk Assessment   Upper Extremity Assessment Upper Extremity Assessment: Overall WFL for tasks assessed    Lower Extremity Assessment Lower Extremity Assessment: Generalized weakness (Mild; consistent with pre-op diagnosis)    Cervical / Trunk Assessment Cervical / Trunk Assessment: Back Surgery  Communication   Communication Communication: No apparent difficulties    Cognition Arousal: Alert Behavior During Therapy: WFL for tasks assessed/performed   PT - Cognitive impairments: No apparent impairments                         Following commands: Intact       Cueing Cueing Techniques: Verbal cues, Gestural cues     General Comments      Exercises     Assessment/Plan    PT Assessment Patient needs continued PT services  PT Problem List Decreased strength;Decreased activity tolerance;Decreased balance;Decreased mobility;Decreased safety awareness;Decreased knowledge of use of DME;Decreased knowledge of precautions;Pain       PT Treatment Interventions DME instruction;Gait training;Stair training;Functional mobility training;Therapeutic activities;Therapeutic exercise;Balance training;Patient/family education    PT Goals (Current goals can be found in the Care  Plan section)  Acute Rehab PT Goals Patient Stated Goal: Home today PT Goal Formulation: With patient/family Time For Goal Achievement: 10/28/23 Potential to Achieve Goals: Good    Frequency Min 5X/week     Co-evaluation               AM-PAC PT "6 Clicks" Mobility  Outcome Measure Help needed turning from your back to your side while in a flat bed without using bedrails?: A Little Help needed moving from lying on your back to sitting on the side of a flat bed without using bedrails?: A Little Help needed moving to and from a bed to a chair (including a wheelchair)?: A Little Help needed standing up from a chair using your arms (e.g., wheelchair or bedside chair)?: A Little Help needed to walk in hospital room?: A Little Help needed climbing 3-5 steps with a railing? : A Little 6 Click Score: 18    End of Session Equipment Utilized During Treatment: Gait belt;Back brace Activity Tolerance: Patient tolerated treatment well Patient left: in bed;with family/visitor present;with call bell/phone within reach Nurse Communication: Mobility status PT Visit Diagnosis: Unsteadiness on feet (R26.81);Pain Pain - part of body:  (back)    Time: [id number] PT Time Calculation (min) (ACUTE ONLY): 31 min   Charges:   PT Evaluation $PT Eval Low Complexity: 1 Low PT Treatments $Gait Training: 8-22 mins PT General Charges $$ ACUTE PT VISIT: 1 Visit         Conni Slipper, PT, DPT Acute Rehabilitation Services Secure Chat Preferred Office: [phone]   Marylynn Pearson 10/21/2023, 10:19 AM

## 2023-10-21 NOTE — Progress Notes (Signed)
Patient alert and oriented, mae's well, voiding adequate amount of urine, swallowing without difficulty, no c/o pain at time of discharge. Patient discharged home with family. Script and discharged instructions given to patient. Patient and family stated understanding of instructions given. Patient has an appointment with Dr. Jenkins   

## 2023-10-21 NOTE — Plan of Care (Signed)

## 2023-10-21 NOTE — Discharge Summary (Signed)
 Physician Discharge Summary  Patient ID: Veronica Montes MRN: [id number] DOB/AGE: [birth year] 58 y.o.  Admit date: 10/20/2023 Discharge date: 10/21/2023  Admission Diagnoses: Lumbago, lumbar facet arthropathy, lumbar spondylolisthesis  Discharge Diagnoses: The same Principal Problem:   Spondylolisthesis of lumbar region   Discharged Condition: good  Hospital Course: I performed an L4-5 decompression, instrumentation and fusion on the patient on 10/20/2023.  The surgery went well.  The patient's postoperative course was unremarkable.  On postoperative day #1 the patient was doing well and requested discharge home.  Patient, and her daughter, were given verbal and written discharge instructions.  All their questions were answered.    Consults: PT, care management Significant Diagnostic Studies: None Treatments: L4-5 decompression, instrumentation and fusion. Discharge Exam: Blood pressure 112/86, pulse 61, temperature 98.6 F (37 C), temperature source Oral, resp. rate 18, height 5\' 3"  (1.6 m), weight 61.7 kg, SpO2 97%. The patient is alert and pleasant.  She looks well.  Her strength is normal.  Disposition: Home  Discharge Instructions     Call MD for:  difficulty breathing, headache or visual disturbances   Complete by: As directed    Call MD for:  extreme fatigue   Complete by: As directed    Call MD for:  hives   Complete by: As directed    Call MD for:  persistant dizziness or light-headedness   Complete by: As directed    Call MD for:  persistant nausea and vomiting   Complete by: As directed    Call MD for:  redness, tenderness, or signs of infection (pain, swelling, redness, odor or green/yellow discharge around incision site)   Complete by: As directed    Call MD for:  severe uncontrolled pain   Complete by: As directed    Call MD for:  temperature >100.4   Complete by: As directed    Diet - low sodium heart healthy   Complete by: As directed    Discharge  instructions   Complete by: As directed    Call [phone] for a followup appointment. Take a stool softener while you are using pain medications.   Driving Restrictions   Complete by: As directed    Do not drive for 2 weeks.   Increase activity slowly   Complete by: As directed    Lifting restrictions   Complete by: As directed    Do not lift more than 5 pounds. No excessive bending or twisting.   May shower / Bathe   Complete by: As directed    Remove the dressing for 3 days after surgery.  You may shower, but leave the incision alone.   Remove dressing in 48 hours   Complete by: As directed       Allergies as of 10/21/2023       Reactions   Nsaids Swelling   Face and eyes swell   Percocet [oxycodone-acetaminophen] Swelling        Medication List     STOP taking these medications    meloxicam 7.5 MG tablet Commonly known as: MOBIC   traMADol 50 MG tablet Commonly known as: ULTRAM       TAKE these medications    acetaminophen 650 MG CR tablet Commonly known as: TYLENOL Take 1,300 mg by mouth every 8 (eight) hours as needed for pain.   cyclobenzaprine 10 MG tablet Commonly known as: FLEXERIL Take 1 tablet (10 mg total) by mouth 3 (three) times daily as needed for muscle spasms.   desvenlafaxine 50 MG  24 hr tablet Commonly known as: PRISTIQ Take 50 mg by mouth daily.   docusate sodium 100 MG capsule Commonly known as: COLACE Take 1 capsule (100 mg total) by mouth 2 (two) times daily.   estradiol 0.5 MG tablet Commonly known as: ESTRACE Take 0.5 mg by mouth daily.   gabapentin 300 MG capsule Commonly known as: NEURONTIN Take 300 mg by mouth at bedtime.   HYDROmorphone 2 MG tablet Commonly known as: DILAUDID Take 1-2 tablets (2-4 mg total) by mouth every 4 (four) hours as needed for severe pain (pain score 7-10) or moderate pain (pain score 4-6).   LORazepam 1 MG tablet Commonly known as: ATIVAN Take 1 to 2 tablets at bedtime as needed What  changed:  how much to take how to take this when to take this additional instructions   omeprazole 40 MG capsule Commonly known as: PRILOSEC Take 40 mg by mouth daily.   traZODone 150 MG tablet Commonly known as: DESYREL 150 mg at bedtime.         Signed: Cristi Loron 10/21/2023, 7:56 AM

## 2023-10-21 NOTE — Anesthesia Postprocedure Evaluation (Signed)
 Anesthesia Post Note  Patient: Veronica Montes  Procedure(s) Performed: LUMBAR FOUR-FIVE POSTERIOR LUMBAR INTERBODY FUSION (Spine Lumbar)     Patient location during evaluation: PACU Anesthesia Type: General Level of consciousness: awake and alert Pain management: pain level controlled Vital Signs Assessment: post-procedure vital signs reviewed and stable Respiratory status: spontaneous breathing, nonlabored ventilation and respiratory function stable Cardiovascular status: blood pressure returned to baseline and stable Postop Assessment: no apparent nausea or vomiting Anesthetic complications: no   No notable events documented.  Last Vitals:    Last Pain:                 [name]

## 2023-10-22 MED FILL — Thrombin For Soln 5000 Unit: CUTANEOUS | Qty: 5000 | Status: AC

## 2023-10-24 MED FILL — Sodium Chloride IV Soln 0.9%: INTRAVENOUS | Qty: 1000 | Status: AC

## 2023-10-24 MED FILL — Heparin Sodium (Porcine) Inj 1000 Unit/ML: INTRAMUSCULAR | Qty: 30 | Status: AC

## 2023-10-27 ENCOUNTER — Ambulatory Visit: Payer: 59

## 2023-10-29 DIAGNOSIS — M4316 Spondylolisthesis, lumbar region: Secondary | ICD-10-CM | POA: Diagnosis not present

## 2023-11-11 DIAGNOSIS — M4316 Spondylolisthesis, lumbar region: Secondary | ICD-10-CM | POA: Diagnosis not present

## 2023-11-17 DIAGNOSIS — D225 Melanocytic nevi of trunk: Secondary | ICD-10-CM | POA: Diagnosis not present

## 2023-11-17 DIAGNOSIS — D2262 Melanocytic nevi of left upper limb, including shoulder: Secondary | ICD-10-CM | POA: Diagnosis not present

## 2023-11-17 DIAGNOSIS — Z85820 Personal history of malignant melanoma of skin: Secondary | ICD-10-CM | POA: Diagnosis not present

## 2023-11-17 DIAGNOSIS — Z85828 Personal history of other malignant neoplasm of skin: Secondary | ICD-10-CM | POA: Diagnosis not present

## 2023-11-17 DIAGNOSIS — D2261 Melanocytic nevi of right upper limb, including shoulder: Secondary | ICD-10-CM | POA: Diagnosis not present

## 2023-11-17 DIAGNOSIS — D2272 Melanocytic nevi of left lower limb, including hip: Secondary | ICD-10-CM | POA: Diagnosis not present

## 2023-12-15 DIAGNOSIS — E782 Mixed hyperlipidemia: Secondary | ICD-10-CM | POA: Diagnosis not present

## 2023-12-15 DIAGNOSIS — K219 Gastro-esophageal reflux disease without esophagitis: Secondary | ICD-10-CM | POA: Diagnosis not present

## 2023-12-15 DIAGNOSIS — F419 Anxiety disorder, unspecified: Secondary | ICD-10-CM | POA: Diagnosis not present

## 2023-12-15 DIAGNOSIS — Z79899 Other long term (current) drug therapy: Secondary | ICD-10-CM | POA: Diagnosis not present

## 2023-12-22 ENCOUNTER — Ambulatory Visit

## 2024-01-23 DIAGNOSIS — R051 Acute cough: Secondary | ICD-10-CM | POA: Diagnosis not present

## 2024-01-23 DIAGNOSIS — J069 Acute upper respiratory infection, unspecified: Secondary | ICD-10-CM | POA: Diagnosis not present

## 2024-01-23 DIAGNOSIS — R0981 Nasal congestion: Secondary | ICD-10-CM | POA: Diagnosis not present

## 2024-02-05 DIAGNOSIS — J019 Acute sinusitis, unspecified: Secondary | ICD-10-CM | POA: Diagnosis not present

## 2024-02-05 DIAGNOSIS — B9689 Other specified bacterial agents as the cause of diseases classified elsewhere: Secondary | ICD-10-CM | POA: Diagnosis not present

## 2024-02-10 DIAGNOSIS — M4316 Spondylolisthesis, lumbar region: Secondary | ICD-10-CM | POA: Diagnosis not present

## 2024-02-10 DIAGNOSIS — R635 Abnormal weight gain: Secondary | ICD-10-CM | POA: Diagnosis not present

## 2024-02-10 DIAGNOSIS — M549 Dorsalgia, unspecified: Secondary | ICD-10-CM | POA: Diagnosis not present

## 2024-03-15 ENCOUNTER — Ambulatory Visit
Admission: RE | Admit: 2024-03-15 | Discharge: 2024-03-15 | Disposition: A | Discharge status: Home / Self Care | Source: Ambulatory Visit | Attending: Internal Medicine | Admitting: Internal Medicine

## 2024-03-15 DIAGNOSIS — Z79899 Other long term (current) drug therapy: Secondary | ICD-10-CM | POA: Diagnosis not present

## 2024-03-15 DIAGNOSIS — Z1231 Encounter for screening mammogram for malignant neoplasm of breast: Secondary | ICD-10-CM | POA: Insufficient documentation

## 2024-03-15 DIAGNOSIS — E782 Mixed hyperlipidemia: Secondary | ICD-10-CM | POA: Diagnosis not present

## 2024-04-15 DIAGNOSIS — R310 Gross hematuria: Secondary | ICD-10-CM | POA: Diagnosis not present

## 2024-04-15 DIAGNOSIS — H8110 Benign paroxysmal vertigo, unspecified ear: Secondary | ICD-10-CM | POA: Diagnosis not present

## 2024-04-26 DIAGNOSIS — H8111 Benign paroxysmal vertigo, right ear: Secondary | ICD-10-CM | POA: Diagnosis not present

## 2024-05-25 DIAGNOSIS — R3915 Urgency of urination: Secondary | ICD-10-CM | POA: Diagnosis not present

## 2024-05-25 DIAGNOSIS — R350 Frequency of micturition: Secondary | ICD-10-CM | POA: Diagnosis not present

## 2024-05-25 DIAGNOSIS — M545 Low back pain, unspecified: Secondary | ICD-10-CM | POA: Diagnosis not present

## 2024-06-21 DIAGNOSIS — F419 Anxiety disorder, unspecified: Secondary | ICD-10-CM | POA: Diagnosis not present

## 2024-06-21 DIAGNOSIS — Z1331 Encounter for screening for depression: Secondary | ICD-10-CM | POA: Diagnosis not present

## 2024-06-21 DIAGNOSIS — Z79899 Other long term (current) drug therapy: Secondary | ICD-10-CM | POA: Diagnosis not present

## 2024-06-21 DIAGNOSIS — E782 Mixed hyperlipidemia: Secondary | ICD-10-CM | POA: Diagnosis not present

## 2024-06-21 DIAGNOSIS — K219 Gastro-esophageal reflux disease without esophagitis: Secondary | ICD-10-CM | POA: Diagnosis not present

## 2024-06-22 ENCOUNTER — Other Ambulatory Visit: Payer: Self-pay | Admitting: Internal Medicine

## 2024-06-22 DIAGNOSIS — E782 Mixed hyperlipidemia: Secondary | ICD-10-CM

## 2024-06-22 DIAGNOSIS — Z0000 Encounter for general adult medical examination without abnormal findings: Secondary | ICD-10-CM

## 2024-06-28 DIAGNOSIS — E782 Mixed hyperlipidemia: Secondary | ICD-10-CM | POA: Diagnosis not present

## 2024-06-28 DIAGNOSIS — Z79899 Other long term (current) drug therapy: Secondary | ICD-10-CM | POA: Diagnosis not present

## 2024-07-05 ENCOUNTER — Ambulatory Visit: Admission: RE | Admit: 2024-07-05 | Source: Ambulatory Visit

## 2024-07-19 ENCOUNTER — Ambulatory Visit

## 2024-07-19 DIAGNOSIS — M5442 Lumbago with sciatica, left side: Secondary | ICD-10-CM | POA: Diagnosis not present

## 2024-07-19 DIAGNOSIS — G8929 Other chronic pain: Secondary | ICD-10-CM | POA: Diagnosis not present

## 2024-07-27 DIAGNOSIS — M5442 Lumbago with sciatica, left side: Secondary | ICD-10-CM | POA: Diagnosis not present

## 2024-08-30 ENCOUNTER — Ambulatory Visit: Payer: Self-pay
